# Patient Record
Sex: Male | Born: 1935 | Race: White | Hispanic: No | Marital: Married | Smoking: Former smoker
Health system: Southern US, Community
[De-identification: ages and names within clinical notes are randomized; demographics above are authoritative.]

## PROBLEM LIST (undated history)

## (undated) DIAGNOSIS — C439 Malignant melanoma of skin, unspecified: Secondary | ICD-10-CM

## (undated) DIAGNOSIS — M1710 Unilateral primary osteoarthritis, unspecified knee: Secondary | ICD-10-CM

## (undated) DIAGNOSIS — S2239XA Fracture of one rib, unspecified side, initial encounter for closed fracture: Secondary | ICD-10-CM

## (undated) DIAGNOSIS — N529 Male erectile dysfunction, unspecified: Secondary | ICD-10-CM

## (undated) DIAGNOSIS — M179 Osteoarthritis of knee, unspecified: Secondary | ICD-10-CM

## (undated) DIAGNOSIS — I4819 Other persistent atrial fibrillation: Secondary | ICD-10-CM

## (undated) DIAGNOSIS — I10 Essential (primary) hypertension: Secondary | ICD-10-CM

## (undated) DIAGNOSIS — I77819 Aortic ectasia, unspecified site: Secondary | ICD-10-CM

## (undated) DIAGNOSIS — Z974 Presence of external hearing-aid: Secondary | ICD-10-CM

## (undated) DIAGNOSIS — I351 Nonrheumatic aortic (valve) insufficiency: Secondary | ICD-10-CM

## (undated) DIAGNOSIS — R945 Abnormal results of liver function studies: Secondary | ICD-10-CM

## (undated) DIAGNOSIS — R7989 Other specified abnormal findings of blood chemistry: Secondary | ICD-10-CM

## (undated) DIAGNOSIS — M11269 Other chondrocalcinosis, unspecified knee: Secondary | ICD-10-CM

## (undated) DIAGNOSIS — E119 Type 2 diabetes mellitus without complications: Secondary | ICD-10-CM

## (undated) DIAGNOSIS — I4891 Unspecified atrial fibrillation: Secondary | ICD-10-CM

## (undated) DIAGNOSIS — R0602 Shortness of breath: Secondary | ICD-10-CM

## (undated) DIAGNOSIS — K219 Gastro-esophageal reflux disease without esophagitis: Secondary | ICD-10-CM

## (undated) DIAGNOSIS — H9319 Tinnitus, unspecified ear: Secondary | ICD-10-CM

## (undated) DIAGNOSIS — M7060 Trochanteric bursitis, unspecified hip: Secondary | ICD-10-CM

## (undated) DIAGNOSIS — G4700 Insomnia, unspecified: Secondary | ICD-10-CM

## (undated) DIAGNOSIS — R32 Unspecified urinary incontinence: Secondary | ICD-10-CM

## (undated) DIAGNOSIS — M5416 Radiculopathy, lumbar region: Secondary | ICD-10-CM

## (undated) DIAGNOSIS — W19XXXA Unspecified fall, initial encounter: Secondary | ICD-10-CM

## (undated) DIAGNOSIS — A048 Other specified bacterial intestinal infections: Secondary | ICD-10-CM

## (undated) DIAGNOSIS — I499 Cardiac arrhythmia, unspecified: Secondary | ICD-10-CM

## (undated) DIAGNOSIS — N400 Enlarged prostate without lower urinary tract symptoms: Secondary | ICD-10-CM

## (undated) DIAGNOSIS — I052 Rheumatic mitral stenosis with insufficiency: Secondary | ICD-10-CM

## (undated) HISTORY — DX: Shortness of breath: R06.02

## (undated) HISTORY — PX: CATARACT EXTRACTION: SUR2

## (undated) HISTORY — DX: Unspecified atrial fibrillation: I48.91

## (undated) HISTORY — DX: Gastro-esophageal reflux disease without esophagitis: K21.9

## (undated) HISTORY — DX: Nonrheumatic aortic (valve) insufficiency: I35.1

## (undated) HISTORY — DX: Tinnitus, unspecified ear: H93.19

## (undated) HISTORY — DX: Other specified bacterial intestinal infections: A04.8

## (undated) HISTORY — DX: Other chondrocalcinosis, unspecified knee: M11.269

## (undated) HISTORY — DX: Fracture of one rib, unspecified side, initial encounter for closed fracture: S22.39XA

## (undated) HISTORY — DX: Presence of external hearing-aid: Z97.4

## (undated) HISTORY — DX: Other specified abnormal findings of blood chemistry: R79.89

## (undated) HISTORY — DX: Unspecified urinary incontinence: R32

## (undated) HISTORY — DX: Rheumatic mitral stenosis with insufficiency: I05.2

## (undated) HISTORY — DX: Trochanteric bursitis, unspecified hip: M70.60

## (undated) HISTORY — DX: Benign prostatic hyperplasia without lower urinary tract symptoms: N40.0

## (undated) HISTORY — DX: Male erectile dysfunction, unspecified: N52.9

## (undated) HISTORY — DX: Cardiac arrhythmia, unspecified: I49.9

## (undated) HISTORY — DX: Essential (primary) hypertension: I10

## (undated) HISTORY — DX: Abnormal results of liver function studies: R94.5

## (undated) HISTORY — DX: Radiculopathy, lumbar region: M54.16

## (undated) HISTORY — DX: Insomnia, unspecified: G47.00

## (undated) HISTORY — DX: Unilateral primary osteoarthritis, unspecified knee: M17.10

## (undated) HISTORY — DX: Unspecified fall, initial encounter: W19.XXXA

## (undated) HISTORY — DX: Aortic ectasia, unspecified site: I77.819

## (undated) HISTORY — DX: Osteoarthritis of knee, unspecified: M17.9

## (undated) HISTORY — PX: TONSILLECTOMY: SUR1361

## (undated) HISTORY — DX: Malignant melanoma of skin, unspecified: C43.9

## (undated) HISTORY — DX: Type 2 diabetes mellitus without complications: E11.9

## (undated) HISTORY — PX: CHOLECYSTECTOMY: SHX55

## (undated) HISTORY — DX: Other persistent atrial fibrillation: I48.19

---

## 1990-10-29 DIAGNOSIS — A048 Other specified bacterial intestinal infections: Secondary | ICD-10-CM

## 1990-10-29 HISTORY — DX: Other specified bacterial intestinal infections: A04.8

## 1992-10-29 DIAGNOSIS — I10 Essential (primary) hypertension: Secondary | ICD-10-CM

## 1992-10-29 HISTORY — DX: Essential (primary) hypertension: I10

## 2001-09-30 ENCOUNTER — Encounter: Payer: Self-pay | Admitting: Internal Medicine

## 2001-09-30 ENCOUNTER — Encounter: Admission: RE | Admit: 2001-09-30 | Discharge: 2001-09-30 | Payer: Self-pay | Admitting: Internal Medicine

## 2003-06-14 ENCOUNTER — Inpatient Hospital Stay (HOSPITAL_COMMUNITY): Admission: AD | Admit: 2003-06-14 | Discharge: 2003-06-17 | Payer: Self-pay | Admitting: Internal Medicine

## 2003-06-14 ENCOUNTER — Encounter: Payer: Self-pay | Admitting: Internal Medicine

## 2003-06-15 ENCOUNTER — Encounter: Payer: Self-pay | Admitting: Gastroenterology

## 2003-06-15 ENCOUNTER — Encounter: Payer: Self-pay | Admitting: Internal Medicine

## 2003-06-16 ENCOUNTER — Encounter (INDEPENDENT_AMBULATORY_CARE_PROVIDER_SITE_OTHER): Payer: Self-pay | Admitting: *Deleted

## 2004-02-22 ENCOUNTER — Encounter (INDEPENDENT_AMBULATORY_CARE_PROVIDER_SITE_OTHER): Payer: Self-pay | Admitting: *Deleted

## 2004-02-22 ENCOUNTER — Ambulatory Visit (HOSPITAL_COMMUNITY): Admission: RE | Admit: 2004-02-22 | Discharge: 2004-02-22 | Payer: Self-pay | Admitting: Gastroenterology

## 2008-10-29 DIAGNOSIS — M5416 Radiculopathy, lumbar region: Secondary | ICD-10-CM

## 2008-10-29 DIAGNOSIS — M7060 Trochanteric bursitis, unspecified hip: Secondary | ICD-10-CM

## 2008-10-29 HISTORY — DX: Radiculopathy, lumbar region: M54.16

## 2008-10-29 HISTORY — DX: Trochanteric bursitis, unspecified hip: M70.60

## 2009-04-20 ENCOUNTER — Encounter: Admission: RE | Admit: 2009-04-20 | Discharge: 2009-04-20 | Payer: Self-pay | Admitting: Otolaryngology

## 2011-03-16 NOTE — Op Note (Signed)
[name], [name]                            ACCOUNT NO.:  [id number]   MEDICAL RECORD NO.:  [id number]                   PATIENT TYPE:  INP   LOCATION:  5148                                 FACILITY:  [facility]   PHYSICIAN:  Velora Heckler, M.D.                DATE OF BIRTH:  19-Aug-1931   DATE OF PROCEDURE:  06/16/2003  DATE OF DISCHARGE:                                 OPERATIVE REPORT   PREOPERATIVE DIAGNOSIS:  Symptomatic cholelithiasis, choledocholithiasis.   POSTOPERATIVE DIAGNOSIS:  Symptomatic cholelithiasis, choledocholithiasis.   OPERATION PERFORMED:  Laparoscopic cholecystectomy.   SURGEON:  Velora Heckler, M.D.   ASSISTANT:  Magnus Ivan, RN   ANESTHESIA:  General.   ESTIMATED BLOOD LOSS:  Minimal.   COMPLICATIONS:  None.   PREPARATION:  Betadine.   INDICATIONS FOR PROCEDURE:  The patient is a 77 year old white male who  presented with a history of right upper quadrant abdominal pain.  He was  seen by his primary care physician and admitted to [facility].  Ultrasound demonstrated gallstones.  Liver function tests were abnormal.  Repeat liver function tests showed a marked elevation of total bilirubin and  transaminases.  The patient was seen by gastroenterology and underwent ERCP  with sphincterotomy and stone extraction.  The patient now comes to surgery  for cholecystectomy for cholelithiasis and history of common bile duct  stones.   DESCRIPTION OF PROCEDURE:  The procedure was done in operating room #16  at  the [facility]. [facility].  The patient was brought to the  operating room and placed in supine position on the operating room table.  Following administration of general anesthesia, the patient was prepped and  draped in the usual strict aseptic fashion.  After ascertaining that an  adequate level of anesthesia had been obtained,  an infraumbilical incision  was made with a #15 blade and carried down to the fascia.  The fascia  was  incised in the midline and the peritoneal cavity was entered cautiously.  A  0 Vicryl pursestring suture was placed in the fascia.  A Hasson cannula was  introduced and secured with a pursestring suture.  The abdomen was  insufflated with carbon dioxide.  The laparoscope was introduced under  direct vision.  Operative ports were placed along the right costal margin in  the midline, midclavicular line, and anterior axillary line.  Fundus of the  gallbladder was grasped and retracted cephalad.  Dissection was begun at the  neck of the gallbladder.  Cystic duct was dissected out along its length,  triply clipped and divided.  Cystic artery was dissected out, triply  clipped, and divided.  The gallbladder was then excised from the gallbladder  bed using the hook electrocautery for hemostasis.  A large venous tributary  on the left side of the gallbladder bed was also clipped with a Ligaclip.  The gallbladder was  completely excised and placed into an EndoCatch bag.  It  was extracted through the umbilical port without difficulty.  The right  upper quadrant was copiously irrigated with warm saline which was evacuated.  Good hemostasis was noted.  Ports were removed under direct vision.  Good  hemostasis was noted at all port sites.  Pneumoperitoneum was released.  All  ports were removed.  0 Vicryl pursestring suture was tied securely.  Port  sites were anesthetized with local Marcaine anesthetic.  All four sites were  closed with interrupted 4-0 Vicryl subcuticular sutures.  The wounds were  washed and dried and benzoin and Steri-Strips were applied.  Sterile gauze  dressings were applied.  The patient was awakened from anesthesia and  brought to the recovery room in stable condition.  The patient tolerated the  procedure well.                                                 Velora Heckler, M.D.    TMG/MEDQ  D:  06/16/2003  T:  06/17/2003  Job:  [id number]   cc:   Theressa Millard, M.D.   [address] [place]  [place] [address]  Fax: [phone]

## 2011-03-16 NOTE — Op Note (Signed)
[name], [name] NO.:  [id number]   MEDICAL RECORD NO.:  [id number]                   PATIENT TYPE:  INP   LOCATION:  5148                                 FACILITY:  [facility]   PHYSICIAN:  Graylin Shiver, M.D.                DATE OF BIRTH:  05-31-1931   DATE OF PROCEDURE:  06/16/2003  DATE OF DISCHARGE:                                 OPERATIVE REPORT   PROCEDURE PERFORMED:  Endoscopic retrograde cholangiopancreatography with  sphincterotomy.   INDICATIONS FOR PROCEDURE:  Cholecystitis, choledocholithiasis probable,  based on findings of gallstone and elevated liver enzymes.   The procedure was explained along with the potential risks of bleeding,  infection, perforation and pancreatitis.   PREMEDICATION:  Fentanyl 100 mcg IV, Versed 12 mg IV.   DESCRIPTION OF PROCEDURE:  With the patient in the left lateral position,  the Olympus lateral viewing duodenoscope was inserted into the oropharynx  and passed into the esophagus.  It was advanced down the esophagus, then  into the stomach, then into the duodenum.  No abnormalities were seen in the  stomach or the duodenum.  The papilla of Vater was located.  There was a  diverticulum adjacent to the papilla.  Selective cannulation of the common  bile duct was achieved using a guidewire and papillotome.  Contrast was  injected into the common bile duct and biliary tree.  There were a couple of  small stones noted in the distal common bile duct.  There was no dilatation  of the biliary tree.  There was some slight filling of the cystic duct but  no filling of the gallbladder.  The procedure was done in the endo unit  using a C-arm.  After proper localization of the sphincterotome, a  sphincterotomy was made with good results.  The sphincterotome was then  removed leaving the guidewire in place in the biliary tree.  The 8.5 mm  endoscopic balloon was advanced over the guidewire and up into the bile  duct. It was inflated and the duct was swept twice with removal of stones,  repeat injection showed a clear common bile duct.  The patient tolerated the  procedure well without complications.    IMPRESSION:  Choledocholithiasis status post sphincterotomy with stone  extraction.   PLAN:  Proceed with laparoscopic cholecystectomy.                                               Graylin Shiver, M.D.    SFG/MEDQ  D:  06/16/2003  T:  06/16/2003  Job:  [id number]   cc:   Theressa Millard, M.D.  [address] [place]  [place] [address]  Fax: [phone]   Velora Heckler, M.D.  [phone] [address]  [place]  [place] [address]  Fax: [phone]

## 2011-03-16 NOTE — Consult Note (Signed)
[name], [name] NO.:  [id number]   MEDICAL RECORD NO.:  [id number]                   PATIENT TYPE:  INP   LOCATION:  5148                                 FACILITY:  [facility]   PHYSICIAN:  Velora Heckler, M.D.                DATE OF BIRTH:  09/05/1931   DATE OF CONSULTATION:  06/14/2003  DATE OF DISCHARGE:                                   CONSULTATION   REFERRING PHYSICIAN:  Theressa Millard, M.D.   CHIEF COMPLAINT:  Right upper quadrant abdominal pain.   HISTORY OF PRESENT ILLNESS:  The patient is a 77 year old white male who  presents with a history of onset of right upper quadrant abdominal pain  approximately noon today following a large lunch of roast beef.  This was  associated with mild nausea.  Pain progressed.  The patient went to the  local pharmacy and took a dose of Pepto-Bismol without symptomatic relief.  The pain became more severe.  The patient presented to Theressa Millard, M.D.  office for evaluation.  He was then admitted directly to [facility].  The patient underwent ultrasound of the abdomen which  demonstrated a solitary 7 mm nonmobile gallstone.  No other acute findings  were noted.  Laboratory studies are pending.  The patient gives a history of  a similar episode occurring approximately two weeks ago.  At that time the  episode lasted approximately one hour.  The patient now notes that the pain  is resolving after approximately six to seven hours' duration.  General  surgery is consulted.   PAST MEDICAL HISTORY:  1. Status post right knee surgery.  2. History of nasal fracture with repair.  3. History of type 2 diabetes.  4. History of hypertension.  5. History of gastroesophageal reflux disease.  6. History of hypercholesterolemia.   MEDICATIONS:  1. Actos 10 mg q.a.m.  2. DiaBeta 2.5 mg daily.  3. Vasotec.  4. TriCor 160 mg daily.  5. Lipitor 20 mg daily.  6. Glucophage 2000 mg daily.  7. Prilosec 20 mg  daily.  8. Aspirin.   ALLERGIES:  PENICILLIN causing swelling and rash.   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  He smokes a cigar occasionally.  He  drinks alcohol socially.  He is accompanied by multiple family members.   FAMILY HISTORY:  No significant family history.  No adverse reactions to  anesthesia or surgery.   REVIEW OF SYSTEMS:  15 system review without significant other positive  except as noted above.   PHYSICAL EXAMINATION:  GENERAL:  A 77 year old bright, alert white male on  ward [address] in no acute distress.  VITAL SIGNS:  Temperature 98.7, pulse 115, respirations 20, blood pressure  145/67, O2 saturation 92% on room air.  HEENT:  Normocephalic.  Sclerae are  clear.  Conjunctivae are clear.  Dentition is fair.  Voice is normal.  NECK:  Symmetric.  Thyroid is normal without nodularity.  There is no  anterior/posterior cervical adenopathy.  LUNGS:  Clear to auscultation without rales or rhonchi.  CARDIAC:  Regular rate and rhythm without murmur.  Peripheral pulses are  full.  ABDOMEN:  Soft without distention.  Bowel sounds are present.  On percussion  and palpation there is no significant tenderness.  There is no  hepatosplenomegaly.  There are no palpable masses.  There is no sign of  hernia.  EXTREMITIES:  Nontender without edema.  NEUROLOGIC:  The patient is alert and oriented without focal deficit.   DATA REVIEWED:  Abdominal ultrasound results are reviewed.  This shows a 7  mm nonmobile density in the gallbladder consistent with cholelithiasis.  There are no acute inflammatory findings.  Laboratory studies to include  CBC, comprehensive metabolic profile, coagulation studies, amylase, lipase,  and urinalysis are all still pending at the time of dictation.   IMPRESSION:  Symptomatic cholelithiasis, probable episode of biliary colic,  now resolving.   PLAN:  1. Observation  overnight.  2. Follow up laboratory studies.  3. Follow up physical examination in a.m. August 17.  4. Follow up in office for consideration for elective cholecystectomy at a     later date.                                               Velora Heckler, M.D.    TMG/MEDQ  D:  06/14/2003  T:  06/15/2003  Job:  [id number]   cc:   Theressa Millard, M.D.  [address] [place]  [place] [address]  Fax: [phone]

## 2011-03-16 NOTE — Consult Note (Signed)
[name], [name]                            ACCOUNT NO.:  [id number]   MEDICAL RECORD NO.:  [id number]                   PATIENT TYPE:  INP   LOCATION:  5148                                 FACILITY:  [facility]   PHYSICIAN:  James L. Malon Kindle., M.D.          DATE OF BIRTH:  07/29/1931   DATE OF CONSULTATION:  06/15/2003  DATE OF DISCHARGE:                                   CONSULTATION   REFERRING PHYSICIAN:  Dr. Theressa Millard.   REASON FOR CONSULTATION:  Possible common duct stone.   HISTORY:  Very nice 77 year old gentleman who got a gallbladder ultrasound  several years ago and it was normal.  He presented with new onset of right  upper quadrant pain with severe nausea and was seen by Dr. Earl Gala in the  office and admitted to the hospital.  Ultrasound showed gallstones, fatty  liver, but no clear dilated ducts.  The patient clinically feels better.  His white count was normal at 6.0 with a normal amylase and lipase.  Liver  tests on admitted revealed a total bilirubin of 2.6, increasing to 3.8 with  elevated transaminases.  Alkaline phosphatase was normal.  The issue is one  of possible common duct stone.   CURRENT MEDICATIONS:  1. Glucophage 500 mg two b.i.d.  2. Ambien 5 mg q.h.s.  3. Vasotec 20 mg daily.  4. DiaBeta 5 mg at lunch.  5. Motrin p.r.n.  6. Viagra one-half tablet p.r.n.  7. Prilosec 20 mg daily.  8. Vioxx p.r.n.  9. Tricor 160 mg daily.   ALLERGIES:  The patient is allergic to PENICILLIN.   MEDICAL HISTORY:  1. Non-insulin-dependent diabetes mellitus.  2. He has a history of hypertension.  3. Gastroesophageal reflux disease.  4. Chronic low back pain.   PAST SURGICAL HISTORY:  He has had a previous colonoscopy for heme-positive  stools in 1992 and apparently was unremarkable.  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   FAMILY HISTORY:  Family history is remarkable for heart  disease,  hypertension and diabetes; negative for GI cancer.   SOCIAL HISTORY:  He is married, is retired, drinks occasionally.   PHYSICAL EXAMINATION:  VITAL SIGNS:  The patient is afebrile, vital signs  normal.  GENERAL:  A pleasant, alert, oriented white male in no acute distress.  EYES:  Sclerae nonicteric.  NECK:  Neck is supple with no lymphadenopathy.  LUNGS:  Clear.  HEART:  Regular rate and rhythm without murmurs or gallops.  ABDOMEN:  Abdomen is soft and nontender with good bowel sounds.   ASSESSMENT:  Gallstones with elevated liver tests.  I agree that a common  duct stone is highly likely and the stone may well be ball-valving in the  common duct.  I agree with Dr. Velora Heckler that a preoperative endoscopic  retrograde cholangiopancreatogram in  this patient will be appropriate.  He  and I have discussed the case.   PLAN:  We will proceed at this time with an ERCP with possible  sphincterotomy and stone extraction.  This will be done later this afternoon  by either Dr. Graylin Shiver or Dr. Petra Kuba.  The risks of bleeding  and pancreatitis have been discussed with the patient.                                               James L. Malon Kindle., M.D.    Waldron Session  D:  06/15/2003  T:  06/16/2003  Job:  [id number]   cc:   Theressa Millard, M.D.  [address] [place]  [place] [address]  Fax: [phone]   Velora Heckler, M.D.  [phone] [address] [place]  [place] [address]  Fax: [phone]

## 2011-03-16 NOTE — Discharge Summary (Signed)
   [name], [name]                            ACCOUNT NO.:  [id number]   MEDICAL RECORD NO.:  [id number]                   PATIENT TYPE:  INP   LOCATION:  5148                                 FACILITY:  [facility]   PHYSICIAN:  Velora Heckler, M.D.                DATE OF BIRTH:  02/19/1931   DATE OF ADMISSION:  06/14/2003  DATE OF DISCHARGE:  06/17/2003                                 DISCHARGE SUMMARY   REASON FOR ADMISSION:  Abdominal pain.   BRIEF HISTORY:  The patient is a 77 year old white male admitted by Dr.  Theressa Millard from the office for acute onset right upper quadrant abdominal  pain and nausea.  Ultrasound demonstrated a solitary gallstone.  The patient  had a similar episode approximately two weeks prior to admission.  The  patient was seen in consultation by general surgery.  He had elevated liver  function tests on laboratory studies.  After counseling with patient and his  family, decision was made to proceed to the operating room.  He was also  seen in consultation by Dr. Carman Ching from gastroenterology.   The patient was prepared and taken to the operating room on August 18.  He  underwent laparoscopic cholecystectomy without complications.  Postoperatively the patient did well and was discharged home the following  day.   DISCHARGE PLANNING:  The patient is discharged home June 17, 2003, in good  condition, tolerating a regular diet, ambulating independently.  The patient  will be seen back at my office at [facility] in two to three  weeks.   DISCHARGE MEDICATIONS:  Include Vicodin as needed for pain.   FINAL DIAGNOSES:  1. Biliary colic.  2.     Cholelithiasis.  3. Rule out choledocholithiasis.   CONDITION ON DISCHARGE:  Improved.                                                Velora Heckler, M.D.    TMG/MEDQ  D:  08/19/2003  T:  08/19/2003  Job:  [id number]   cc:   Theressa Millard, M.D.  [address] [place]  [place]  [address]  Fax: [phone]   Llana Aliment. Malon Kindle., M.D.  [address]  [place]  [place] [address]  Fax: [phone]   [facility]

## 2011-10-30 DIAGNOSIS — W19XXXA Unspecified fall, initial encounter: Secondary | ICD-10-CM

## 2011-10-30 DIAGNOSIS — S2239XA Fracture of one rib, unspecified side, initial encounter for closed fracture: Secondary | ICD-10-CM

## 2011-10-30 HISTORY — DX: Unspecified fall, initial encounter: W19.XXXA

## 2011-10-30 HISTORY — DX: Fracture of one rib, unspecified side, initial encounter for closed fracture: S22.39XA

## 2011-11-21 DIAGNOSIS — R05 Cough: Secondary | ICD-10-CM | POA: Diagnosis not present

## 2012-03-04 DIAGNOSIS — H903 Sensorineural hearing loss, bilateral: Secondary | ICD-10-CM | POA: Diagnosis not present

## 2012-03-17 DIAGNOSIS — E7800 Pure hypercholesterolemia, unspecified: Secondary | ICD-10-CM | POA: Diagnosis not present

## 2012-03-17 DIAGNOSIS — E11319 Type 2 diabetes mellitus with unspecified diabetic retinopathy without macular edema: Secondary | ICD-10-CM | POA: Diagnosis not present

## 2012-03-17 DIAGNOSIS — E1139 Type 2 diabetes mellitus with other diabetic ophthalmic complication: Secondary | ICD-10-CM | POA: Diagnosis not present

## 2012-03-17 DIAGNOSIS — I10 Essential (primary) hypertension: Secondary | ICD-10-CM | POA: Diagnosis not present

## 2012-04-03 ENCOUNTER — Other Ambulatory Visit: Payer: Self-pay | Admitting: Dermatology

## 2012-04-03 DIAGNOSIS — C44529 Squamous cell carcinoma of skin of other part of trunk: Secondary | ICD-10-CM | POA: Diagnosis not present

## 2012-04-03 DIAGNOSIS — C4359 Malignant melanoma of other part of trunk: Secondary | ICD-10-CM | POA: Diagnosis not present

## 2012-04-03 DIAGNOSIS — L820 Inflamed seborrheic keratosis: Secondary | ICD-10-CM | POA: Diagnosis not present

## 2012-04-03 DIAGNOSIS — L821 Other seborrheic keratosis: Secondary | ICD-10-CM | POA: Diagnosis not present

## 2012-04-03 DIAGNOSIS — C4360 Malignant melanoma of unspecified upper limb, including shoulder: Secondary | ICD-10-CM | POA: Diagnosis not present

## 2012-04-03 DIAGNOSIS — D045 Carcinoma in situ of skin of trunk: Secondary | ICD-10-CM | POA: Diagnosis not present

## 2012-04-03 DIAGNOSIS — L570 Actinic keratosis: Secondary | ICD-10-CM | POA: Diagnosis not present

## 2012-04-14 DIAGNOSIS — E11329 Type 2 diabetes mellitus with mild nonproliferative diabetic retinopathy without macular edema: Secondary | ICD-10-CM | POA: Diagnosis not present

## 2012-04-14 DIAGNOSIS — C4360 Malignant melanoma of unspecified upper limb, including shoulder: Secondary | ICD-10-CM | POA: Diagnosis not present

## 2012-04-14 DIAGNOSIS — E1139 Type 2 diabetes mellitus with other diabetic ophthalmic complication: Secondary | ICD-10-CM | POA: Diagnosis not present

## 2012-04-15 ENCOUNTER — Other Ambulatory Visit: Payer: Self-pay | Admitting: Dermatology

## 2012-04-15 DIAGNOSIS — C4360 Malignant melanoma of unspecified upper limb, including shoulder: Secondary | ICD-10-CM | POA: Diagnosis not present

## 2012-09-30 DIAGNOSIS — E11319 Type 2 diabetes mellitus with unspecified diabetic retinopathy without macular edema: Secondary | ICD-10-CM | POA: Diagnosis not present

## 2012-09-30 DIAGNOSIS — E1139 Type 2 diabetes mellitus with other diabetic ophthalmic complication: Secondary | ICD-10-CM | POA: Diagnosis not present

## 2012-09-30 DIAGNOSIS — Z0000 Encounter for general adult medical examination without abnormal findings: Secondary | ICD-10-CM | POA: Diagnosis not present

## 2012-09-30 DIAGNOSIS — Z1331 Encounter for screening for depression: Secondary | ICD-10-CM | POA: Diagnosis not present

## 2012-09-30 DIAGNOSIS — I10 Essential (primary) hypertension: Secondary | ICD-10-CM | POA: Diagnosis not present

## 2012-10-03 ENCOUNTER — Other Ambulatory Visit: Payer: Self-pay | Admitting: Dermatology

## 2012-10-03 DIAGNOSIS — L819 Disorder of pigmentation, unspecified: Secondary | ICD-10-CM | POA: Diagnosis not present

## 2012-10-03 DIAGNOSIS — L570 Actinic keratosis: Secondary | ICD-10-CM | POA: Diagnosis not present

## 2012-10-03 DIAGNOSIS — L821 Other seborrheic keratosis: Secondary | ICD-10-CM | POA: Diagnosis not present

## 2012-10-03 DIAGNOSIS — Z85828 Personal history of other malignant neoplasm of skin: Secondary | ICD-10-CM | POA: Diagnosis not present

## 2012-10-03 DIAGNOSIS — Z85820 Personal history of malignant melanoma of skin: Secondary | ICD-10-CM | POA: Diagnosis not present

## 2012-10-03 DIAGNOSIS — D485 Neoplasm of uncertain behavior of skin: Secondary | ICD-10-CM | POA: Diagnosis not present

## 2012-11-03 DIAGNOSIS — S2239XA Fracture of one rib, unspecified side, initial encounter for closed fracture: Secondary | ICD-10-CM | POA: Diagnosis not present

## 2013-03-18 DIAGNOSIS — E11319 Type 2 diabetes mellitus with unspecified diabetic retinopathy without macular edema: Secondary | ICD-10-CM | POA: Diagnosis not present

## 2013-03-18 DIAGNOSIS — G479 Sleep disorder, unspecified: Secondary | ICD-10-CM | POA: Diagnosis not present

## 2013-03-18 DIAGNOSIS — I10 Essential (primary) hypertension: Secondary | ICD-10-CM | POA: Diagnosis not present

## 2013-03-18 DIAGNOSIS — E1139 Type 2 diabetes mellitus with other diabetic ophthalmic complication: Secondary | ICD-10-CM | POA: Diagnosis not present

## 2013-03-27 ENCOUNTER — Other Ambulatory Visit: Payer: Self-pay | Admitting: Dermatology

## 2013-03-27 DIAGNOSIS — Z85828 Personal history of other malignant neoplasm of skin: Secondary | ICD-10-CM | POA: Diagnosis not present

## 2013-03-27 DIAGNOSIS — L570 Actinic keratosis: Secondary | ICD-10-CM | POA: Diagnosis not present

## 2013-03-27 DIAGNOSIS — D485 Neoplasm of uncertain behavior of skin: Secondary | ICD-10-CM | POA: Diagnosis not present

## 2013-03-27 DIAGNOSIS — C44319 Basal cell carcinoma of skin of other parts of face: Secondary | ICD-10-CM | POA: Diagnosis not present

## 2013-03-27 DIAGNOSIS — L821 Other seborrheic keratosis: Secondary | ICD-10-CM | POA: Diagnosis not present

## 2013-04-16 DIAGNOSIS — H353190 Nonexudative age-related macular degeneration, unspecified eye, stage unspecified: Secondary | ICD-10-CM | POA: Diagnosis not present

## 2013-04-20 DIAGNOSIS — Z85828 Personal history of other malignant neoplasm of skin: Secondary | ICD-10-CM | POA: Diagnosis not present

## 2013-04-20 DIAGNOSIS — C44319 Basal cell carcinoma of skin of other parts of face: Secondary | ICD-10-CM | POA: Diagnosis not present

## 2013-04-27 DIAGNOSIS — L570 Actinic keratosis: Secondary | ICD-10-CM | POA: Diagnosis not present

## 2013-07-26 DIAGNOSIS — Z23 Encounter for immunization: Secondary | ICD-10-CM | POA: Diagnosis not present

## 2013-10-05 DIAGNOSIS — E1139 Type 2 diabetes mellitus with other diabetic ophthalmic complication: Secondary | ICD-10-CM | POA: Diagnosis not present

## 2013-10-05 DIAGNOSIS — Z0000 Encounter for general adult medical examination without abnormal findings: Secondary | ICD-10-CM | POA: Diagnosis not present

## 2013-10-05 DIAGNOSIS — E11319 Type 2 diabetes mellitus with unspecified diabetic retinopathy without macular edema: Secondary | ICD-10-CM | POA: Diagnosis not present

## 2013-10-05 DIAGNOSIS — Z1331 Encounter for screening for depression: Secondary | ICD-10-CM | POA: Diagnosis not present

## 2013-10-05 DIAGNOSIS — I10 Essential (primary) hypertension: Secondary | ICD-10-CM | POA: Diagnosis not present

## 2013-10-12 DIAGNOSIS — M25569 Pain in unspecified knee: Secondary | ICD-10-CM | POA: Diagnosis not present

## 2013-10-12 DIAGNOSIS — R29898 Other symptoms and signs involving the musculoskeletal system: Secondary | ICD-10-CM | POA: Diagnosis not present

## 2013-10-13 DIAGNOSIS — Z85828 Personal history of other malignant neoplasm of skin: Secondary | ICD-10-CM | POA: Diagnosis not present

## 2013-10-13 DIAGNOSIS — D235 Other benign neoplasm of skin of trunk: Secondary | ICD-10-CM | POA: Diagnosis not present

## 2013-10-13 DIAGNOSIS — L570 Actinic keratosis: Secondary | ICD-10-CM | POA: Diagnosis not present

## 2013-10-13 DIAGNOSIS — L259 Unspecified contact dermatitis, unspecified cause: Secondary | ICD-10-CM | POA: Diagnosis not present

## 2013-10-13 DIAGNOSIS — L821 Other seborrheic keratosis: Secondary | ICD-10-CM | POA: Diagnosis not present

## 2014-01-26 DIAGNOSIS — M1710 Unilateral primary osteoarthritis, unspecified knee: Secondary | ICD-10-CM | POA: Diagnosis not present

## 2014-01-26 DIAGNOSIS — IMO0002 Reserved for concepts with insufficient information to code with codable children: Secondary | ICD-10-CM | POA: Diagnosis not present

## 2014-01-26 DIAGNOSIS — M1909 Primary osteoarthritis, other specified site: Secondary | ICD-10-CM | POA: Diagnosis not present

## 2014-01-26 DIAGNOSIS — M25569 Pain in unspecified knee: Secondary | ICD-10-CM | POA: Diagnosis not present

## 2014-02-04 DIAGNOSIS — M1909 Primary osteoarthritis, other specified site: Secondary | ICD-10-CM | POA: Diagnosis not present

## 2014-03-03 DIAGNOSIS — M1710 Unilateral primary osteoarthritis, unspecified knee: Secondary | ICD-10-CM | POA: Diagnosis not present

## 2014-03-26 DIAGNOSIS — L570 Actinic keratosis: Secondary | ICD-10-CM | POA: Diagnosis not present

## 2014-03-26 DIAGNOSIS — Z85828 Personal history of other malignant neoplasm of skin: Secondary | ICD-10-CM | POA: Diagnosis not present

## 2014-03-26 DIAGNOSIS — L821 Other seborrheic keratosis: Secondary | ICD-10-CM | POA: Diagnosis not present

## 2014-04-01 DIAGNOSIS — H04129 Dry eye syndrome of unspecified lacrimal gland: Secondary | ICD-10-CM | POA: Diagnosis not present

## 2014-04-01 DIAGNOSIS — E119 Type 2 diabetes mellitus without complications: Secondary | ICD-10-CM | POA: Diagnosis not present

## 2014-04-01 DIAGNOSIS — H353190 Nonexudative age-related macular degeneration, unspecified eye, stage unspecified: Secondary | ICD-10-CM | POA: Diagnosis not present

## 2014-04-05 DIAGNOSIS — M1710 Unilateral primary osteoarthritis, unspecified knee: Secondary | ICD-10-CM | POA: Diagnosis not present

## 2014-04-06 DIAGNOSIS — G479 Sleep disorder, unspecified: Secondary | ICD-10-CM | POA: Diagnosis not present

## 2014-04-06 DIAGNOSIS — E1139 Type 2 diabetes mellitus with other diabetic ophthalmic complication: Secondary | ICD-10-CM | POA: Diagnosis not present

## 2014-04-06 DIAGNOSIS — I10 Essential (primary) hypertension: Secondary | ICD-10-CM | POA: Diagnosis not present

## 2014-04-06 DIAGNOSIS — E11319 Type 2 diabetes mellitus with unspecified diabetic retinopathy without macular edema: Secondary | ICD-10-CM | POA: Diagnosis not present

## 2014-06-25 DIAGNOSIS — M1710 Unilateral primary osteoarthritis, unspecified knee: Secondary | ICD-10-CM | POA: Diagnosis not present

## 2014-07-14 DIAGNOSIS — H905 Unspecified sensorineural hearing loss: Secondary | ICD-10-CM | POA: Diagnosis not present

## 2014-07-14 DIAGNOSIS — H903 Sensorineural hearing loss, bilateral: Secondary | ICD-10-CM | POA: Diagnosis not present

## 2014-07-26 DIAGNOSIS — M1710 Unilateral primary osteoarthritis, unspecified knee: Secondary | ICD-10-CM | POA: Diagnosis not present

## 2014-08-08 DIAGNOSIS — Z23 Encounter for immunization: Secondary | ICD-10-CM | POA: Diagnosis not present

## 2014-10-11 DIAGNOSIS — M1712 Unilateral primary osteoarthritis, left knee: Secondary | ICD-10-CM | POA: Diagnosis not present

## 2014-10-13 DIAGNOSIS — Z8709 Personal history of other diseases of the respiratory system: Secondary | ICD-10-CM | POA: Diagnosis not present

## 2014-10-13 DIAGNOSIS — I10 Essential (primary) hypertension: Secondary | ICD-10-CM | POA: Diagnosis not present

## 2014-10-13 DIAGNOSIS — E08319 Diabetes mellitus due to underlying condition with unspecified diabetic retinopathy without macular edema: Secondary | ICD-10-CM | POA: Diagnosis not present

## 2014-10-13 DIAGNOSIS — K219 Gastro-esophageal reflux disease without esophagitis: Secondary | ICD-10-CM | POA: Diagnosis not present

## 2014-10-13 DIAGNOSIS — Z0000 Encounter for general adult medical examination without abnormal findings: Secondary | ICD-10-CM | POA: Diagnosis not present

## 2014-10-13 DIAGNOSIS — E780 Pure hypercholesterolemia: Secondary | ICD-10-CM | POA: Diagnosis not present

## 2014-10-13 DIAGNOSIS — E11319 Type 2 diabetes mellitus with unspecified diabetic retinopathy without macular edema: Secondary | ICD-10-CM | POA: Diagnosis not present

## 2015-01-20 DIAGNOSIS — E785 Hyperlipidemia, unspecified: Secondary | ICD-10-CM | POA: Diagnosis not present

## 2015-01-20 DIAGNOSIS — I10 Essential (primary) hypertension: Secondary | ICD-10-CM | POA: Diagnosis not present

## 2015-04-05 DIAGNOSIS — L920 Granuloma annulare: Secondary | ICD-10-CM | POA: Diagnosis not present

## 2015-04-05 DIAGNOSIS — L812 Freckles: Secondary | ICD-10-CM | POA: Diagnosis not present

## 2015-04-05 DIAGNOSIS — L578 Other skin changes due to chronic exposure to nonionizing radiation: Secondary | ICD-10-CM | POA: Diagnosis not present

## 2015-04-05 DIAGNOSIS — L821 Other seborrheic keratosis: Secondary | ICD-10-CM | POA: Diagnosis not present

## 2015-04-05 DIAGNOSIS — Z85828 Personal history of other malignant neoplasm of skin: Secondary | ICD-10-CM | POA: Diagnosis not present

## 2015-04-05 DIAGNOSIS — L570 Actinic keratosis: Secondary | ICD-10-CM | POA: Diagnosis not present

## 2015-04-11 DIAGNOSIS — E11319 Type 2 diabetes mellitus with unspecified diabetic retinopathy without macular edema: Secondary | ICD-10-CM | POA: Diagnosis not present

## 2015-04-11 DIAGNOSIS — I10 Essential (primary) hypertension: Secondary | ICD-10-CM | POA: Diagnosis not present

## 2015-04-11 DIAGNOSIS — Z23 Encounter for immunization: Secondary | ICD-10-CM | POA: Diagnosis not present

## 2015-04-14 DIAGNOSIS — E119 Type 2 diabetes mellitus without complications: Secondary | ICD-10-CM | POA: Diagnosis not present

## 2015-04-14 DIAGNOSIS — H524 Presbyopia: Secondary | ICD-10-CM | POA: Diagnosis not present

## 2015-04-14 DIAGNOSIS — H3531 Nonexudative age-related macular degeneration: Secondary | ICD-10-CM | POA: Diagnosis not present

## 2015-05-19 DIAGNOSIS — Z85828 Personal history of other malignant neoplasm of skin: Secondary | ICD-10-CM | POA: Diagnosis not present

## 2015-05-19 DIAGNOSIS — L720 Epidermal cyst: Secondary | ICD-10-CM | POA: Diagnosis not present

## 2015-05-19 DIAGNOSIS — L570 Actinic keratosis: Secondary | ICD-10-CM | POA: Diagnosis not present

## 2015-07-25 DIAGNOSIS — M1712 Unilateral primary osteoarthritis, left knee: Secondary | ICD-10-CM | POA: Diagnosis not present

## 2015-09-08 DIAGNOSIS — Z23 Encounter for immunization: Secondary | ICD-10-CM | POA: Diagnosis not present

## 2015-10-03 DIAGNOSIS — M542 Cervicalgia: Secondary | ICD-10-CM | POA: Diagnosis not present

## 2015-10-03 DIAGNOSIS — Z0000 Encounter for general adult medical examination without abnormal findings: Secondary | ICD-10-CM | POA: Diagnosis not present

## 2015-10-03 DIAGNOSIS — I10 Essential (primary) hypertension: Secondary | ICD-10-CM | POA: Diagnosis not present

## 2015-10-03 DIAGNOSIS — E7800 Pure hypercholesterolemia, unspecified: Secondary | ICD-10-CM | POA: Diagnosis not present

## 2015-10-03 DIAGNOSIS — E11319 Type 2 diabetes mellitus with unspecified diabetic retinopathy without macular edema: Secondary | ICD-10-CM | POA: Diagnosis not present

## 2015-10-03 DIAGNOSIS — K219 Gastro-esophageal reflux disease without esophagitis: Secondary | ICD-10-CM | POA: Diagnosis not present

## 2015-10-03 DIAGNOSIS — Z7984 Long term (current) use of oral hypoglycemic drugs: Secondary | ICD-10-CM | POA: Diagnosis not present

## 2015-10-04 DIAGNOSIS — Z85828 Personal history of other malignant neoplasm of skin: Secondary | ICD-10-CM | POA: Diagnosis not present

## 2015-10-04 DIAGNOSIS — D692 Other nonthrombocytopenic purpura: Secondary | ICD-10-CM | POA: Diagnosis not present

## 2015-10-04 DIAGNOSIS — L820 Inflamed seborrheic keratosis: Secondary | ICD-10-CM | POA: Diagnosis not present

## 2015-10-04 DIAGNOSIS — L570 Actinic keratosis: Secondary | ICD-10-CM | POA: Diagnosis not present

## 2015-10-04 DIAGNOSIS — L821 Other seborrheic keratosis: Secondary | ICD-10-CM | POA: Diagnosis not present

## 2015-11-28 DIAGNOSIS — M71572 Other bursitis, not elsewhere classified, left ankle and foot: Secondary | ICD-10-CM | POA: Diagnosis not present

## 2015-11-28 DIAGNOSIS — M722 Plantar fascial fibromatosis: Secondary | ICD-10-CM | POA: Diagnosis not present

## 2015-11-28 DIAGNOSIS — M1712 Unilateral primary osteoarthritis, left knee: Secondary | ICD-10-CM | POA: Diagnosis not present

## 2015-11-29 DIAGNOSIS — M722 Plantar fascial fibromatosis: Secondary | ICD-10-CM | POA: Diagnosis not present

## 2015-12-01 DIAGNOSIS — M722 Plantar fascial fibromatosis: Secondary | ICD-10-CM | POA: Diagnosis not present

## 2015-12-07 DIAGNOSIS — M722 Plantar fascial fibromatosis: Secondary | ICD-10-CM | POA: Diagnosis not present

## 2015-12-12 DIAGNOSIS — M722 Plantar fascial fibromatosis: Secondary | ICD-10-CM | POA: Diagnosis not present

## 2015-12-12 DIAGNOSIS — M71572 Other bursitis, not elsewhere classified, left ankle and foot: Secondary | ICD-10-CM | POA: Diagnosis not present

## 2015-12-12 DIAGNOSIS — M1712 Unilateral primary osteoarthritis, left knee: Secondary | ICD-10-CM | POA: Diagnosis not present

## 2016-01-02 DIAGNOSIS — M1712 Unilateral primary osteoarthritis, left knee: Secondary | ICD-10-CM | POA: Diagnosis not present

## 2016-01-02 DIAGNOSIS — M71572 Other bursitis, not elsewhere classified, left ankle and foot: Secondary | ICD-10-CM | POA: Diagnosis not present

## 2016-01-02 DIAGNOSIS — M722 Plantar fascial fibromatosis: Secondary | ICD-10-CM | POA: Diagnosis not present

## 2016-02-22 DIAGNOSIS — H5319 Other subjective visual disturbances: Secondary | ICD-10-CM | POA: Diagnosis not present

## 2016-02-22 DIAGNOSIS — E119 Type 2 diabetes mellitus without complications: Secondary | ICD-10-CM | POA: Diagnosis not present

## 2016-02-22 DIAGNOSIS — H04123 Dry eye syndrome of bilateral lacrimal glands: Secondary | ICD-10-CM | POA: Diagnosis not present

## 2016-03-02 DIAGNOSIS — H353132 Nonexudative age-related macular degeneration, bilateral, intermediate dry stage: Secondary | ICD-10-CM | POA: Diagnosis not present

## 2016-03-02 DIAGNOSIS — Z961 Presence of intraocular lens: Secondary | ICD-10-CM | POA: Diagnosis not present

## 2016-03-02 DIAGNOSIS — H53411 Scotoma involving central area, right eye: Secondary | ICD-10-CM | POA: Diagnosis not present

## 2016-03-02 DIAGNOSIS — H35372 Puckering of macula, left eye: Secondary | ICD-10-CM | POA: Diagnosis not present

## 2016-03-02 DIAGNOSIS — E119 Type 2 diabetes mellitus without complications: Secondary | ICD-10-CM | POA: Diagnosis not present

## 2016-04-06 DIAGNOSIS — Z7984 Long term (current) use of oral hypoglycemic drugs: Secondary | ICD-10-CM | POA: Diagnosis not present

## 2016-04-06 DIAGNOSIS — I10 Essential (primary) hypertension: Secondary | ICD-10-CM | POA: Diagnosis not present

## 2016-04-06 DIAGNOSIS — E11319 Type 2 diabetes mellitus with unspecified diabetic retinopathy without macular edema: Secondary | ICD-10-CM | POA: Diagnosis not present

## 2016-04-06 DIAGNOSIS — Z1389 Encounter for screening for other disorder: Secondary | ICD-10-CM | POA: Diagnosis not present

## 2016-04-06 DIAGNOSIS — K219 Gastro-esophageal reflux disease without esophagitis: Secondary | ICD-10-CM | POA: Diagnosis not present

## 2016-04-06 DIAGNOSIS — E782 Mixed hyperlipidemia: Secondary | ICD-10-CM | POA: Diagnosis not present

## 2016-04-06 DIAGNOSIS — G4700 Insomnia, unspecified: Secondary | ICD-10-CM | POA: Diagnosis not present

## 2016-04-10 DIAGNOSIS — L570 Actinic keratosis: Secondary | ICD-10-CM | POA: Diagnosis not present

## 2016-04-10 DIAGNOSIS — Z85828 Personal history of other malignant neoplasm of skin: Secondary | ICD-10-CM | POA: Diagnosis not present

## 2016-04-10 DIAGNOSIS — L821 Other seborrheic keratosis: Secondary | ICD-10-CM | POA: Diagnosis not present

## 2016-04-10 DIAGNOSIS — D485 Neoplasm of uncertain behavior of skin: Secondary | ICD-10-CM | POA: Diagnosis not present

## 2016-04-11 DIAGNOSIS — E119 Type 2 diabetes mellitus without complications: Secondary | ICD-10-CM | POA: Diagnosis not present

## 2016-04-11 DIAGNOSIS — H353132 Nonexudative age-related macular degeneration, bilateral, intermediate dry stage: Secondary | ICD-10-CM | POA: Diagnosis not present

## 2016-04-11 DIAGNOSIS — H35372 Puckering of macula, left eye: Secondary | ICD-10-CM | POA: Diagnosis not present

## 2016-04-11 DIAGNOSIS — Z961 Presence of intraocular lens: Secondary | ICD-10-CM | POA: Diagnosis not present

## 2016-04-11 DIAGNOSIS — H53411 Scotoma involving central area, right eye: Secondary | ICD-10-CM | POA: Diagnosis not present

## 2016-04-11 DIAGNOSIS — H35363 Drusen (degenerative) of macula, bilateral: Secondary | ICD-10-CM | POA: Diagnosis not present

## 2016-04-19 DIAGNOSIS — H524 Presbyopia: Secondary | ICD-10-CM | POA: Diagnosis not present

## 2016-04-19 DIAGNOSIS — E119 Type 2 diabetes mellitus without complications: Secondary | ICD-10-CM | POA: Diagnosis not present

## 2016-04-19 DIAGNOSIS — H353132 Nonexudative age-related macular degeneration, bilateral, intermediate dry stage: Secondary | ICD-10-CM | POA: Diagnosis not present

## 2016-04-19 DIAGNOSIS — H04123 Dry eye syndrome of bilateral lacrimal glands: Secondary | ICD-10-CM | POA: Diagnosis not present

## 2016-07-09 DIAGNOSIS — M25562 Pain in left knee: Secondary | ICD-10-CM | POA: Diagnosis not present

## 2016-08-15 DIAGNOSIS — Z23 Encounter for immunization: Secondary | ICD-10-CM | POA: Diagnosis not present

## 2016-09-28 DIAGNOSIS — D045 Carcinoma in situ of skin of trunk: Secondary | ICD-10-CM | POA: Diagnosis not present

## 2016-09-28 DIAGNOSIS — L821 Other seborrheic keratosis: Secondary | ICD-10-CM | POA: Diagnosis not present

## 2016-09-28 DIAGNOSIS — L570 Actinic keratosis: Secondary | ICD-10-CM | POA: Diagnosis not present

## 2016-09-28 DIAGNOSIS — Z85820 Personal history of malignant melanoma of skin: Secondary | ICD-10-CM | POA: Diagnosis not present

## 2016-09-28 DIAGNOSIS — C44519 Basal cell carcinoma of skin of other part of trunk: Secondary | ICD-10-CM | POA: Diagnosis not present

## 2016-09-28 DIAGNOSIS — L920 Granuloma annulare: Secondary | ICD-10-CM | POA: Diagnosis not present

## 2016-09-28 DIAGNOSIS — D485 Neoplasm of uncertain behavior of skin: Secondary | ICD-10-CM | POA: Diagnosis not present

## 2016-09-28 DIAGNOSIS — C44329 Squamous cell carcinoma of skin of other parts of face: Secondary | ICD-10-CM | POA: Diagnosis not present

## 2016-09-28 DIAGNOSIS — Z85828 Personal history of other malignant neoplasm of skin: Secondary | ICD-10-CM | POA: Diagnosis not present

## 2016-10-04 DIAGNOSIS — M1990 Unspecified osteoarthritis, unspecified site: Secondary | ICD-10-CM | POA: Diagnosis not present

## 2016-10-04 DIAGNOSIS — E669 Obesity, unspecified: Secondary | ICD-10-CM | POA: Diagnosis not present

## 2016-10-04 DIAGNOSIS — Z6835 Body mass index (BMI) 35.0-35.9, adult: Secondary | ICD-10-CM | POA: Diagnosis not present

## 2016-10-04 DIAGNOSIS — Z0000 Encounter for general adult medical examination without abnormal findings: Secondary | ICD-10-CM | POA: Diagnosis not present

## 2016-10-04 DIAGNOSIS — Z7984 Long term (current) use of oral hypoglycemic drugs: Secondary | ICD-10-CM | POA: Diagnosis not present

## 2016-10-04 DIAGNOSIS — Z1389 Encounter for screening for other disorder: Secondary | ICD-10-CM | POA: Diagnosis not present

## 2016-10-04 DIAGNOSIS — I10 Essential (primary) hypertension: Secondary | ICD-10-CM | POA: Diagnosis not present

## 2016-10-04 DIAGNOSIS — E782 Mixed hyperlipidemia: Secondary | ICD-10-CM | POA: Diagnosis not present

## 2016-10-04 DIAGNOSIS — E11319 Type 2 diabetes mellitus with unspecified diabetic retinopathy without macular edema: Secondary | ICD-10-CM | POA: Diagnosis not present

## 2016-10-04 DIAGNOSIS — Z23 Encounter for immunization: Secondary | ICD-10-CM | POA: Diagnosis not present

## 2016-10-04 DIAGNOSIS — K219 Gastro-esophageal reflux disease without esophagitis: Secondary | ICD-10-CM | POA: Diagnosis not present

## 2016-10-04 DIAGNOSIS — N183 Chronic kidney disease, stage 3 (moderate): Secondary | ICD-10-CM | POA: Diagnosis not present

## 2016-10-08 DIAGNOSIS — M1712 Unilateral primary osteoarthritis, left knee: Secondary | ICD-10-CM | POA: Diagnosis not present

## 2016-10-08 DIAGNOSIS — H903 Sensorineural hearing loss, bilateral: Secondary | ICD-10-CM | POA: Diagnosis not present

## 2016-10-11 DIAGNOSIS — C44329 Squamous cell carcinoma of skin of other parts of face: Secondary | ICD-10-CM | POA: Diagnosis not present

## 2016-10-11 DIAGNOSIS — Z85828 Personal history of other malignant neoplasm of skin: Secondary | ICD-10-CM | POA: Diagnosis not present

## 2016-10-19 DIAGNOSIS — M1712 Unilateral primary osteoarthritis, left knee: Secondary | ICD-10-CM | POA: Diagnosis not present

## 2016-10-23 DIAGNOSIS — Z0000 Encounter for general adult medical examination without abnormal findings: Secondary | ICD-10-CM | POA: Diagnosis not present

## 2016-10-23 DIAGNOSIS — Z1211 Encounter for screening for malignant neoplasm of colon: Secondary | ICD-10-CM | POA: Diagnosis not present

## 2017-02-14 DIAGNOSIS — M1712 Unilateral primary osteoarthritis, left knee: Secondary | ICD-10-CM | POA: Diagnosis not present

## 2017-02-14 DIAGNOSIS — Z6835 Body mass index (BMI) 35.0-35.9, adult: Secondary | ICD-10-CM | POA: Diagnosis not present

## 2017-02-14 DIAGNOSIS — M150 Primary generalized (osteo)arthritis: Secondary | ICD-10-CM | POA: Diagnosis not present

## 2017-02-14 DIAGNOSIS — M25562 Pain in left knee: Secondary | ICD-10-CM | POA: Diagnosis not present

## 2017-03-07 DIAGNOSIS — J301 Allergic rhinitis due to pollen: Secondary | ICD-10-CM | POA: Diagnosis not present

## 2017-04-04 DIAGNOSIS — Z961 Presence of intraocular lens: Secondary | ICD-10-CM | POA: Diagnosis not present

## 2017-04-04 DIAGNOSIS — H353132 Nonexudative age-related macular degeneration, bilateral, intermediate dry stage: Secondary | ICD-10-CM | POA: Diagnosis not present

## 2017-04-04 DIAGNOSIS — E119 Type 2 diabetes mellitus without complications: Secondary | ICD-10-CM | POA: Diagnosis not present

## 2017-04-04 DIAGNOSIS — H04123 Dry eye syndrome of bilateral lacrimal glands: Secondary | ICD-10-CM | POA: Diagnosis not present

## 2017-04-04 DIAGNOSIS — H524 Presbyopia: Secondary | ICD-10-CM | POA: Diagnosis not present

## 2017-04-08 DIAGNOSIS — N183 Chronic kidney disease, stage 3 (moderate): Secondary | ICD-10-CM | POA: Diagnosis not present

## 2017-04-08 DIAGNOSIS — H9313 Tinnitus, bilateral: Secondary | ICD-10-CM | POA: Diagnosis not present

## 2017-04-08 DIAGNOSIS — E782 Mixed hyperlipidemia: Secondary | ICD-10-CM | POA: Diagnosis not present

## 2017-04-08 DIAGNOSIS — Z6835 Body mass index (BMI) 35.0-35.9, adult: Secondary | ICD-10-CM | POA: Diagnosis not present

## 2017-04-08 DIAGNOSIS — E11319 Type 2 diabetes mellitus with unspecified diabetic retinopathy without macular edema: Secondary | ICD-10-CM | POA: Diagnosis not present

## 2017-04-08 DIAGNOSIS — E669 Obesity, unspecified: Secondary | ICD-10-CM | POA: Diagnosis not present

## 2017-04-08 DIAGNOSIS — I10 Essential (primary) hypertension: Secondary | ICD-10-CM | POA: Diagnosis not present

## 2017-04-08 DIAGNOSIS — E1165 Type 2 diabetes mellitus with hyperglycemia: Secondary | ICD-10-CM | POA: Diagnosis not present

## 2017-04-08 DIAGNOSIS — K219 Gastro-esophageal reflux disease without esophagitis: Secondary | ICD-10-CM | POA: Diagnosis not present

## 2017-04-08 DIAGNOSIS — M1990 Unspecified osteoarthritis, unspecified site: Secondary | ICD-10-CM | POA: Diagnosis not present

## 2017-04-22 DIAGNOSIS — L570 Actinic keratosis: Secondary | ICD-10-CM | POA: Diagnosis not present

## 2017-04-22 DIAGNOSIS — C44321 Squamous cell carcinoma of skin of nose: Secondary | ICD-10-CM | POA: Diagnosis not present

## 2017-04-22 DIAGNOSIS — D485 Neoplasm of uncertain behavior of skin: Secondary | ICD-10-CM | POA: Diagnosis not present

## 2017-04-22 DIAGNOSIS — L218 Other seborrheic dermatitis: Secondary | ICD-10-CM | POA: Diagnosis not present

## 2017-04-22 DIAGNOSIS — L820 Inflamed seborrheic keratosis: Secondary | ICD-10-CM | POA: Diagnosis not present

## 2017-04-22 DIAGNOSIS — L821 Other seborrheic keratosis: Secondary | ICD-10-CM | POA: Diagnosis not present

## 2017-04-22 DIAGNOSIS — Z85828 Personal history of other malignant neoplasm of skin: Secondary | ICD-10-CM | POA: Diagnosis not present

## 2017-04-22 DIAGNOSIS — C44329 Squamous cell carcinoma of skin of other parts of face: Secondary | ICD-10-CM | POA: Diagnosis not present

## 2017-04-22 DIAGNOSIS — L920 Granuloma annulare: Secondary | ICD-10-CM | POA: Diagnosis not present

## 2017-05-14 DIAGNOSIS — Z85828 Personal history of other malignant neoplasm of skin: Secondary | ICD-10-CM | POA: Diagnosis not present

## 2017-05-14 DIAGNOSIS — C4402 Squamous cell carcinoma of skin of lip: Secondary | ICD-10-CM | POA: Diagnosis not present

## 2017-06-08 DIAGNOSIS — M1712 Unilateral primary osteoarthritis, left knee: Secondary | ICD-10-CM | POA: Diagnosis not present

## 2017-07-27 DIAGNOSIS — Z23 Encounter for immunization: Secondary | ICD-10-CM | POA: Diagnosis not present

## 2017-09-30 DIAGNOSIS — M1712 Unilateral primary osteoarthritis, left knee: Secondary | ICD-10-CM | POA: Diagnosis not present

## 2017-10-03 DIAGNOSIS — D1801 Hemangioma of skin and subcutaneous tissue: Secondary | ICD-10-CM | POA: Diagnosis not present

## 2017-10-03 DIAGNOSIS — L578 Other skin changes due to chronic exposure to nonionizing radiation: Secondary | ICD-10-CM | POA: Diagnosis not present

## 2017-10-03 DIAGNOSIS — D692 Other nonthrombocytopenic purpura: Secondary | ICD-10-CM | POA: Diagnosis not present

## 2017-10-03 DIAGNOSIS — L570 Actinic keratosis: Secondary | ICD-10-CM | POA: Diagnosis not present

## 2017-10-03 DIAGNOSIS — Z85828 Personal history of other malignant neoplasm of skin: Secondary | ICD-10-CM | POA: Diagnosis not present

## 2017-10-03 DIAGNOSIS — L821 Other seborrheic keratosis: Secondary | ICD-10-CM | POA: Diagnosis not present

## 2017-10-09 DIAGNOSIS — M1712 Unilateral primary osteoarthritis, left knee: Secondary | ICD-10-CM | POA: Diagnosis not present

## 2017-10-15 DIAGNOSIS — E7800 Pure hypercholesterolemia, unspecified: Secondary | ICD-10-CM | POA: Diagnosis not present

## 2017-10-15 DIAGNOSIS — Z0000 Encounter for general adult medical examination without abnormal findings: Secondary | ICD-10-CM | POA: Diagnosis not present

## 2017-10-15 DIAGNOSIS — M1990 Unspecified osteoarthritis, unspecified site: Secondary | ICD-10-CM | POA: Diagnosis not present

## 2017-10-15 DIAGNOSIS — I10 Essential (primary) hypertension: Secondary | ICD-10-CM | POA: Diagnosis not present

## 2017-10-15 DIAGNOSIS — N183 Chronic kidney disease, stage 3 (moderate): Secondary | ICD-10-CM | POA: Diagnosis not present

## 2017-10-15 DIAGNOSIS — E782 Mixed hyperlipidemia: Secondary | ICD-10-CM | POA: Diagnosis not present

## 2017-10-15 DIAGNOSIS — E08319 Diabetes mellitus due to underlying condition with unspecified diabetic retinopathy without macular edema: Secondary | ICD-10-CM | POA: Diagnosis not present

## 2017-10-15 DIAGNOSIS — K219 Gastro-esophageal reflux disease without esophagitis: Secondary | ICD-10-CM | POA: Diagnosis not present

## 2017-10-15 DIAGNOSIS — Z7984 Long term (current) use of oral hypoglycemic drugs: Secondary | ICD-10-CM | POA: Diagnosis not present

## 2017-10-15 DIAGNOSIS — E11319 Type 2 diabetes mellitus with unspecified diabetic retinopathy without macular edema: Secondary | ICD-10-CM | POA: Diagnosis not present

## 2017-10-15 DIAGNOSIS — Z1389 Encounter for screening for other disorder: Secondary | ICD-10-CM | POA: Diagnosis not present

## 2017-10-16 DIAGNOSIS — M1712 Unilateral primary osteoarthritis, left knee: Secondary | ICD-10-CM | POA: Diagnosis not present

## 2018-01-13 DIAGNOSIS — J069 Acute upper respiratory infection, unspecified: Secondary | ICD-10-CM | POA: Diagnosis not present

## 2018-03-10 DIAGNOSIS — E119 Type 2 diabetes mellitus without complications: Secondary | ICD-10-CM | POA: Diagnosis not present

## 2018-03-10 DIAGNOSIS — H35372 Puckering of macula, left eye: Secondary | ICD-10-CM | POA: Diagnosis not present

## 2018-03-10 DIAGNOSIS — H353132 Nonexudative age-related macular degeneration, bilateral, intermediate dry stage: Secondary | ICD-10-CM | POA: Diagnosis not present

## 2018-03-31 DIAGNOSIS — D692 Other nonthrombocytopenic purpura: Secondary | ICD-10-CM | POA: Diagnosis not present

## 2018-03-31 DIAGNOSIS — D485 Neoplasm of uncertain behavior of skin: Secondary | ICD-10-CM | POA: Diagnosis not present

## 2018-03-31 DIAGNOSIS — C44612 Basal cell carcinoma of skin of right upper limb, including shoulder: Secondary | ICD-10-CM | POA: Diagnosis not present

## 2018-03-31 DIAGNOSIS — L821 Other seborrheic keratosis: Secondary | ICD-10-CM | POA: Diagnosis not present

## 2018-03-31 DIAGNOSIS — D1801 Hemangioma of skin and subcutaneous tissue: Secondary | ICD-10-CM | POA: Diagnosis not present

## 2018-03-31 DIAGNOSIS — L812 Freckles: Secondary | ICD-10-CM | POA: Diagnosis not present

## 2018-03-31 DIAGNOSIS — L920 Granuloma annulare: Secondary | ICD-10-CM | POA: Diagnosis not present

## 2018-03-31 DIAGNOSIS — Z85828 Personal history of other malignant neoplasm of skin: Secondary | ICD-10-CM | POA: Diagnosis not present

## 2018-03-31 DIAGNOSIS — L570 Actinic keratosis: Secondary | ICD-10-CM | POA: Diagnosis not present

## 2018-04-08 DIAGNOSIS — M1990 Unspecified osteoarthritis, unspecified site: Secondary | ICD-10-CM | POA: Diagnosis not present

## 2018-04-08 DIAGNOSIS — I10 Essential (primary) hypertension: Secondary | ICD-10-CM | POA: Diagnosis not present

## 2018-04-08 DIAGNOSIS — G4700 Insomnia, unspecified: Secondary | ICD-10-CM | POA: Diagnosis not present

## 2018-04-08 DIAGNOSIS — E782 Mixed hyperlipidemia: Secondary | ICD-10-CM | POA: Diagnosis not present

## 2018-04-08 DIAGNOSIS — E08319 Diabetes mellitus due to underlying condition with unspecified diabetic retinopathy without macular edema: Secondary | ICD-10-CM | POA: Diagnosis not present

## 2018-04-08 DIAGNOSIS — K219 Gastro-esophageal reflux disease without esophagitis: Secondary | ICD-10-CM | POA: Diagnosis not present

## 2018-05-26 DIAGNOSIS — Z85828 Personal history of other malignant neoplasm of skin: Secondary | ICD-10-CM | POA: Diagnosis not present

## 2018-05-26 DIAGNOSIS — S80811A Abrasion, right lower leg, initial encounter: Secondary | ICD-10-CM | POA: Diagnosis not present

## 2018-06-12 DIAGNOSIS — H524 Presbyopia: Secondary | ICD-10-CM | POA: Diagnosis not present

## 2018-06-12 DIAGNOSIS — E119 Type 2 diabetes mellitus without complications: Secondary | ICD-10-CM | POA: Diagnosis not present

## 2018-06-12 DIAGNOSIS — H04123 Dry eye syndrome of bilateral lacrimal glands: Secondary | ICD-10-CM | POA: Diagnosis not present

## 2018-06-12 DIAGNOSIS — H353132 Nonexudative age-related macular degeneration, bilateral, intermediate dry stage: Secondary | ICD-10-CM | POA: Diagnosis not present

## 2018-06-12 DIAGNOSIS — H5319 Other subjective visual disturbances: Secondary | ICD-10-CM | POA: Diagnosis not present

## 2018-07-07 DIAGNOSIS — M1712 Unilateral primary osteoarthritis, left knee: Secondary | ICD-10-CM | POA: Diagnosis not present

## 2018-08-05 DIAGNOSIS — L609 Nail disorder, unspecified: Secondary | ICD-10-CM | POA: Diagnosis not present

## 2018-08-05 DIAGNOSIS — M1712 Unilateral primary osteoarthritis, left knee: Secondary | ICD-10-CM | POA: Diagnosis not present

## 2018-08-05 DIAGNOSIS — M19072 Primary osteoarthritis, left ankle and foot: Secondary | ICD-10-CM | POA: Diagnosis not present

## 2018-08-05 DIAGNOSIS — M7750 Other enthesopathy of unspecified foot: Secondary | ICD-10-CM | POA: Diagnosis not present

## 2018-08-05 DIAGNOSIS — M19071 Primary osteoarthritis, right ankle and foot: Secondary | ICD-10-CM | POA: Diagnosis not present

## 2018-08-19 DIAGNOSIS — Z23 Encounter for immunization: Secondary | ICD-10-CM | POA: Diagnosis not present

## 2018-09-04 DIAGNOSIS — M19071 Primary osteoarthritis, right ankle and foot: Secondary | ICD-10-CM | POA: Diagnosis not present

## 2018-09-04 DIAGNOSIS — M2012 Hallux valgus (acquired), left foot: Secondary | ICD-10-CM | POA: Diagnosis not present

## 2018-09-04 DIAGNOSIS — M2011 Hallux valgus (acquired), right foot: Secondary | ICD-10-CM | POA: Diagnosis not present

## 2018-09-04 DIAGNOSIS — M19072 Primary osteoarthritis, left ankle and foot: Secondary | ICD-10-CM | POA: Diagnosis not present

## 2018-09-17 DIAGNOSIS — R0602 Shortness of breath: Secondary | ICD-10-CM | POA: Diagnosis not present

## 2018-09-17 DIAGNOSIS — I499 Cardiac arrhythmia, unspecified: Secondary | ICD-10-CM | POA: Diagnosis not present

## 2018-09-17 DIAGNOSIS — I4891 Unspecified atrial fibrillation: Secondary | ICD-10-CM | POA: Diagnosis not present

## 2018-09-30 DIAGNOSIS — L821 Other seborrheic keratosis: Secondary | ICD-10-CM | POA: Diagnosis not present

## 2018-09-30 DIAGNOSIS — L812 Freckles: Secondary | ICD-10-CM | POA: Diagnosis not present

## 2018-09-30 DIAGNOSIS — Z85828 Personal history of other malignant neoplasm of skin: Secondary | ICD-10-CM | POA: Diagnosis not present

## 2018-09-30 DIAGNOSIS — D1801 Hemangioma of skin and subcutaneous tissue: Secondary | ICD-10-CM | POA: Diagnosis not present

## 2018-09-30 DIAGNOSIS — L570 Actinic keratosis: Secondary | ICD-10-CM | POA: Diagnosis not present

## 2018-09-30 DIAGNOSIS — D692 Other nonthrombocytopenic purpura: Secondary | ICD-10-CM | POA: Diagnosis not present

## 2018-09-30 NOTE — Progress Notes (Signed)
Cardiology Office Note   Date:  10/01/2018   ID:  [name], DOB [birth year], MRN [id number]  PCP:  Wenda Low, MD  Cardiologist:   No primary care provider on file. Referring:  Wenda Low, MD   No chief complaint on file.     History of Present Illness: [name] is a 84 y.o. male who presents for evaluation of atrial fibrillation.  He lives part-time in [place].  He noticed while he was there that he was having some increased shortness of breath.  He had some decreased exercise tolerance.  He really did not feel any tachypalpitations.  He did have a tiredness and he felt like his legs were heavier.  When he came back up here and was seen by his primary provider he was noted to be in atrial fibrillation.  He was started on Eliquis and metoprolol.  However, he developed dark urine and actually brought in a bottle that today.  He stopped the Eliquis and his dark urine has cleared up.  He is reluctant to take Eliquis again.  He does not notice rhythm.  He is not having any presyncope or syncope.  He is not having any chest pressure, neck or arm discomfort.  He is never had any cardiac issues before other than some skipped palpitations and it sounds like he wore a Holter years ago.   Past Medical History:  Diagnosis Date  . A-fib ([place])   . Chondrocalcinosis of knee   . DM (diabetes mellitus) ([name])   . ED (erectile dysfunction)   . Elevated LFTs   . Fall 2013  . GERD (gastroesophageal reflux disease)   . H. pylori infection 1992  . Hearing aid worn   . Hypertension 1994  . Insomnia   . Irregular heart beat   . Lumbar radiculopathy 2010  . Melanoma ([place])   . OA (osteoarthritis) of knee   . Prostatism   . Rib fracture 2013  . SOB (shortness of breath)   . Tinnitus   . Trochanteric bursitis 2010  . Urinary incontinence        Current Outpatient Medications  Medication Sig Dispense Refill  . Acetaminophen (TYLENOL ARTHRITIS PAIN PO) Take by mouth. TAKE  ONE TABLETS EVERY NIGHT    . apixaban (ELIQUIS) 5 MG TABS tablet Take 5 mg by mouth 2 (two) times daily.    Marland Kitchen atorvastatin (LIPITOR) 40 MG tablet Take 40 mg by mouth daily.    . Cyanocobalamin (VITAMIN B 12 PO) Take 1,000 mg by mouth.    . ENALAPRIL MALEATE PO Take 20 mg by mouth.    . fenofibrate (TRICOR) 145 MG tablet Take 145 mg by mouth daily.    Marland Kitchen glipiZIDE (GLUCOTROL) 5 MG tablet Take 2.5 mg by mouth daily before breakfast.    . hydrochlorothiazide (HYDRODIURIL) 25 MG tablet Take 25 mg by mouth daily.    . metFORMIN (GLUCOPHAGE) 500 MG tablet Take 500 mg by mouth 2 (two) times daily with a meal.    . METOPROLOL SUCCINATE ER PO Take 25 mg by mouth daily.    . MULTIPLE VITAMIN PO Take by mouth daily.    . Multiple Vitamins-Minerals (RA VISION-VITE PRESERVE PO) Take by mouth 2 (two) times daily.    . naproxen sodium (ALEVE) 220 MG tablet Take 220 mg by mouth every 12 (twelve) hours as needed.    . pantoprazole (PROTONIX) 40 MG tablet Take 40 mg by mouth daily.    Marland Kitchen PIOGLITAZONE  HCL PO Take 40 mg by mouth daily.    Marland Kitchen zolpidem (AMBIEN) 10 MG tablet Take 10 mg by mouth at bedtime as needed for sleep.     No current facility-administered medications for this visit.     Allergies:   Penicillins    Social History:  The patient  reports that he has been smoking cigars. He has never used smokeless tobacco. He reports that he drinks alcohol. He reports that he does not use drugs.   Family History:  The patient's family history includes Diabetes in his father; Pancreatic cancer in his sister; Prostate cancer in his brother.    ROS:  Please see the history of present illness.   Otherwise, review of systems are positive for erectile dysfunction, reflux joint pains.   All other systems are reviewed and negative.    PHYSICAL EXAM: VS:  BP (!) 132/58   Pulse 84   Ht 5\' 11"  (1.803 m)   Wt 237 lb (107.5 kg)   BMI 33.05 kg/m  , BMI Body mass index is 33.05 kg/m. GENERAL:  Well  appearing HEENT:  Pupils equal round and reactive, fundi not visualized, oral mucosa unremarkable NECK:  No jugular venous distention, waveform within normal limits, carotid upstroke brisk and symmetric, no bruits, no thyromegaly LYMPHATICS:  No cervical, inguinal adenopathy LUNGS:  Clear to auscultation bilaterally BACK:  No CVA tenderness CHEST:  Unremarkable HEART:  PMI not displaced or sustained,S1 and S2 within normal limits, no S3,  no clicks, no rubs, no murmurs, irregular ABD:  Flat, positive bowel sounds normal in frequency in pitch, no bruits, no rebound, no guarding, no midline pulsatile mass, no hepatomegaly, no splenomegaly EXT:  2 plus pulses throughout, trace edema, no cyanosis no clubbing SKIN:  No rashes no nodules NEURO:  Cranial nerves II through XII grossly intact, motor grossly intact throughout PSYCH:  Cognitively intact, oriented to person place and time    EKG:  EKG is not ordered today. The ekg ordered 09/17/18 atrial fibrillation, rate 92, axis within normal limits, intervals within normal limits, no acute ST-T wave changes.   Recent Labs: No results found for requested labs within last 8760 hours.    Lipid Panel No results found for: CHOL, TRIG, HDL, CHOLHDL, VLDL, LDLCALC, LDLDIRECT    Wt Readings from Last 3 Encounters:  10/01/18 237 lb (107.5 kg)      Other studies Reviewed: Additional studies/ records that were reviewed today include: Office records. Review of the above records demonstrates:  Please see elsewhere in the note.     ASSESSMENT AND PLAN:  ATRIAL FIB: I had a long conversation with the patient and his wife about this.  Today he had some dark urine with him and I am going to send this off to see if there is any hematuria.  He is holding off on the Eliquis for now.  If this was not hematuria I would suggest he restart the Eliquis and we watch his urine.  If it is hematuria he is going to need to see a urologist before starting back on a  blood thinner.  He seemed to be very reluctant to restart Eliquis at this time to make his urine dark and I tried to explain that his risk of stroke is about 4% (Mr. [name] has a CHA2DS2 - VASc score of 4) if he is not on a blood thinner.  Ultimately I think he needs to have cardioversion.  I am going to do a  24-hour Holter to make sure this is persistent atrial fibrillation and that his rate is controlled.  I will be checking an echocardiogram.  Once I get him on 3 weeks of consistent blood thinner I do think he needs cardioversion his he is likely symptomatic with this with his increased fatigue and decreased exercise tolerance.  I do note that his thyroid was checked recently and it was normal.  Electrolytes were normal.  Ultimately he will have to have some of his care in [place] as he is going back there at the end of the month and I will try to arrange follow-up with a cardiologist in [place].  I did call and speak with Wenda Low, MD about this plan.   DYSPNEA: His BNP was slightly elevated.  I will be checking the echo as above.  I suspect some element of diastolic dysfunction along with age and deconditioning and weight.  DARK URINE : Questionable hematuria and this will be sent off.  He does report that his urine is now clear.  ANEMIA: This is noted on his office records.  However, he is not have any active symptoms of bleeding other than the urine as above.  We will follow this.   Current medicines are reviewed at length with the patient today.  The patient does not have concerns regarding medicines.  The following changes have been made:  no change  Labs/ tests ordered today include:   Orders Placed This Encounter  Procedures  . Urinalysis  . HOLTER MONITOR - 24 HOUR  . EKG 12-Lead  . ECHOCARDIOGRAM COMPLETE     Disposition:   FU with me in 2 weeks.      Signed, Minus Breeding, MD  10/01/2018 5:20 PM    [facility]

## 2018-10-01 ENCOUNTER — Ambulatory Visit (INDEPENDENT_AMBULATORY_CARE_PROVIDER_SITE_OTHER): Payer: Medicare Other | Admitting: Cardiology

## 2018-10-01 VITALS — BP 132/58 | HR 84 | Ht 71.0 in | Wt 237.0 lb

## 2018-10-01 DIAGNOSIS — R319 Hematuria, unspecified: Secondary | ICD-10-CM

## 2018-10-01 DIAGNOSIS — I4891 Unspecified atrial fibrillation: Secondary | ICD-10-CM

## 2018-10-01 NOTE — Patient Instructions (Signed)
Medication Instructions:  Continue current medications  If you need a refill on your cardiac medications before your next appointment, please call your pharmacy.  Labwork: Urinalysis   If you have labs (blood work) drawn today and your tests are completely normal, you will receive your results only by: Marland Kitchen MyChart Message (if you have MyChart) OR . A paper copy in the mail If you have any lab test that is abnormal or we need to change your treatment, we will call you to review the results.  Testing/Procedures: Your physician has recommended that you wear a 24 hour holter monitor. Holter monitors are medical devices that record the heart's electrical activity. Doctors most often use these monitors to diagnose arrhythmias. Arrhythmias are problems with the speed or rhythm of the heartbeat. The monitor is a small, portable device. You can wear one while you do your normal daily activities. This is usually used to diagnose what is causing palpitations/syncope (passing out).  Your physician has requested that you have an echocardiogram. Echocardiography is a painless test that uses sound waves to create images of your heart. It provides your doctor with information about the size and shape of your heart and how well your heart's chambers and valves are working. This procedure takes approximately one hour. There are no restrictions for this procedure.  Follow-Up: . You will need a follow up appointment in 2 Weeks.    At [facility], you and your health needs are our priority.  As part of our continuing mission to provide you with exceptional heart care, we have created designated Provider Care Teams.  These Care Teams include your primary Cardiologist (physician) and Advanced Practice Providers (APPs -  Physician Assistants and Nurse Practitioners) who all work together to provide you with the care you need, when you need it.   Thank you for choosing [facility] at [facility]!!

## 2018-10-02 ENCOUNTER — Telehealth: Payer: Self-pay | Admitting: *Deleted

## 2018-10-02 DIAGNOSIS — R8290 Unspecified abnormal findings in urine: Secondary | ICD-10-CM

## 2018-10-02 LAB — URINALYSIS
Bilirubin, UA: NEGATIVE
Glucose, UA: NEGATIVE
Ketones, UA: NEGATIVE
Nitrite, UA: NEGATIVE
Specific Gravity, UA: 1.014 (ref 1.005–1.030)
Urobilinogen, Ur: 1 mg/dL (ref 0.2–1.0)
pH, UA: 6.5 (ref 5.0–7.5)

## 2018-10-02 NOTE — Telephone Encounter (Signed)
-----   Message from Minus Breeding, MD sent at 10/02/2018  1:18 PM EST ----- There is blood in his urine.  I would like for him to see [facility] and have evaluation of this before starting back on blood thinners.  Discussed with Wenda Low, MD.  Please call patient and help him to arrange the new patient appt with Urology.

## 2018-10-02 NOTE — Telephone Encounter (Signed)
Pt aware of his blood work, referral send to [facility].

## 2018-10-03 ENCOUNTER — Ambulatory Visit (HOSPITAL_COMMUNITY): Payer: Medicare Other | Attending: Cardiology

## 2018-10-03 ENCOUNTER — Other Ambulatory Visit: Payer: Self-pay

## 2018-10-03 DIAGNOSIS — I4891 Unspecified atrial fibrillation: Secondary | ICD-10-CM

## 2018-10-08 ENCOUNTER — Encounter: Payer: Self-pay | Admitting: Cardiology

## 2018-10-08 ENCOUNTER — Ambulatory Visit (INDEPENDENT_AMBULATORY_CARE_PROVIDER_SITE_OTHER): Payer: Medicare Other

## 2018-10-08 DIAGNOSIS — I4891 Unspecified atrial fibrillation: Secondary | ICD-10-CM | POA: Diagnosis not present

## 2018-10-08 DIAGNOSIS — R310 Gross hematuria: Secondary | ICD-10-CM | POA: Diagnosis not present

## 2018-10-10 DIAGNOSIS — R310 Gross hematuria: Secondary | ICD-10-CM | POA: Diagnosis not present

## 2018-10-13 DIAGNOSIS — R310 Gross hematuria: Secondary | ICD-10-CM | POA: Diagnosis not present

## 2018-10-13 DIAGNOSIS — N323 Diverticulum of bladder: Secondary | ICD-10-CM | POA: Diagnosis not present

## 2018-10-14 ENCOUNTER — Other Ambulatory Visit (HOSPITAL_COMMUNITY): Payer: Medicare Other

## 2018-10-15 NOTE — Progress Notes (Signed)
Cardiology Office Note   Date:  10/16/2018   ID:  [name], DOB [birth year], MRN [id number]  PCP:  Roger Low, MD  Cardiologist:   No primary care provider on file. Referring:  Roger Low, MD   Chief Complaint  Patient presents with  . Atrial Fibrillation      History of Present Illness: [name] is a 84 y.o. male who presents for evaluation of atrial fibrillation.   I saw him recently for atrial fib.  He was on [name] and metoprolol.  However, he developed dark urine and actually brought in a bottle.  This was positive for blood and I suggested that he restart [name] which he had stopped and see a urologist.  He returns for follow up.  Since he was last seen he saw urologist.  He did have some diverticulum and some erythema.  This could have been the source of some bleeding in his bladder.  He is potentially going to get follow-up study but he is going to be moving to [place] soon where he spends part time.  He is otherwise been feeling well.  He had rate control on his Holter that he wore which demonstrated that his atrial fibrillation is persistent.  He had an echocardiogram which demonstrated some apical akinesis and slightly Hernandez ejection fraction of 50 to 55%.  He feels well.  Is not had any further hematuria.  He does not want to take [name] again.   Past Medical History:  Diagnosis Date  . A-fib ([place])   . Chondrocalcinosis of knee   . DM (diabetes mellitus) ([place])   . ED (erectile dysfunction)   . Elevated LFTs   . Fall 2013  . GERD (gastroesophageal reflux disease)   . H. pylori infection 1992  . Hearing aid worn   . Hypertension 1994  . Insomnia   . Irregular heart beat   . Lumbar radiculopathy 2010  . Melanoma ([place])   . OA (osteoarthritis) of knee   . Prostatism   . Rib fracture 2013  . SOB (shortness of breath)   . Tinnitus   . Trochanteric bursitis 2010  . Urinary incontinence        Current Outpatient Medications  Medication Sig  Dispense Refill  . Acetaminophen (TYLENOL ARTHRITIS PAIN PO) Take by mouth. TAKE ONE TABLETS EVERY NIGHT    . atorvastatin (LIPITOR) 40 MG tablet Take 40 mg by mouth daily.    . Cyanocobalamin (VITAMIN B 12 PO) Take 1,000 mg by mouth.    . ENALAPRIL MALEATE PO Take 20 mg by mouth.    . fenofibrate (TRICOR) 145 MG tablet Take 145 mg by mouth daily.    Marland Kitchen glipiZIDE (GLUCOTROL) 5 MG tablet Take 2.5 mg by mouth daily before breakfast.    . hydrochlorothiazide (HYDRODIURIL) 25 MG tablet Take 25 mg by mouth daily.    . metFORMIN (GLUCOPHAGE) 500 MG tablet Take 500 mg by mouth 2 (two) times daily with a meal.    . MULTIPLE VITAMIN PO Take by mouth daily.    . Multiple Vitamins-Minerals (RA VISION-VITE PRESERVE PO) Take by mouth 2 (two) times daily.    . pantoprazole (PROTONIX) 40 MG tablet Take 40 mg by mouth daily.    Marland Kitchen PIOGLITAZONE HCL PO Take 40 mg by mouth daily.    Marland Kitchen zolpidem (AMBIEN) 10 MG tablet Take 10 mg by mouth at bedtime as needed for sleep.    . metoprolol succinate (TOPROL-XL) 50 MG 24 hr  tablet Take 1 tablet (50 mg total) by mouth daily. 90 tablet 3  . rivaroxaban ([name]) 20 MG TABS tablet Take 1 tablet (20 mg total) by mouth daily with supper. 90 tablet 3   No current facility-administered medications for this visit.     Allergies:   Penicillins    ROS:  Please see the history of present illness.   Otherwise, review of systems are none.   All other systems are reviewed and negative.    PHYSICAL EXAM: VS:  BP 120/62   Pulse (!) 101   Ht 5\' 10"  (1.778 m)   Wt 232 lb (105.2 kg)   BMI 33.29 kg/m  , BMI Body mass index is 33.29 kg/m. GENERAL:  Well appearing NECK:  No jugular venous distention, waveform within normal limits, carotid upstroke brisk and symmetric, no bruits, no thyromegaly LUNGS:  Clear to auscultation bilaterally CHEST:  Unremarkable HEART:  PMI not displaced or sustained,S1 and S2 within normal limits, no S3, no clicks, no rubs, irregular  murmurs ABD:   Flat, positive bowel sounds normal in frequency in pitch, no bruits, no rebound, no guarding, no midline pulsatile mass, no hepatomegaly, no splenomegaly EXT:  2 plus pulses throughout, no edema, no cyanosis no clubbing   EKG:  EKG is  ordered today. The ekg ordered 09/17/18 atrial fibrillation, rate 101, axis within normal limits, intervals within normal limits, no acute ST-T wave changes.   Recent Labs: No results found for requested labs within last 8760 hours.    Lipid Panel No results found for: CHOL, TRIG, HDL, CHOLHDL, VLDL, LDLCALC, LDLDIRECT    Wt Readings from Last 3 Encounters:  10/16/18 232 lb (105.2 kg)  10/01/18 237 lb (107.5 kg)      Other studies Reviewed: Additional studies/ records that were reviewed today include: Echo, urology note, Holter Review of the above records demonstrates:  See elsewhere   ASSESSMENT AND PLAN:  ATRIAL FIB:   Mr. [name] has a CHA2DS2 - VASc score of 4.  We had another long discussion about this.  He understands the risks and he agrees to try anticoagulation again but does not take [name] I will give him [name].  We will give him a slightly higher dose of metoprolol as his rate is a little bit high today but he is overall fairly well controlled.  He could consider cardioversion although I do not think there is significant benefit as he does not feel his fibrillation but he will discuss this with his new cardiologist when he gets to [place].  DYSPNEA:   His BNP was slightly elevated.  This is probably related to the slightly reduced ejection fraction.  I think he will do well with salt and fluid restriction and weight loss.  Right now he is not seeing he has any shortness of breath.  No change in therapy.   HEMATURIA: As above.  He can follow-up with urology.  There was apparently no contraindication to restarting his blood thinner.: Questionable hematuria and this will be sent off.  He does report that his urine is now  clear.  ANEMIA:   His hemoglobin was 11.3 previously.  I will check a CBC after he is been on [name] for couple of weeks and before he moves to [place].  Current medicines are reviewed at length with the patient today.  The patient does not have concerns regarding medicines.  The following changes have been made:  As above  Labs/ tests ordered today include:  Orders Placed This Encounter  Procedures  . CBC  . EKG 12-Lead     Disposition:   FU with me in when he returns from [facility].  I will put a six month visit on the books.  Ronnell Guadalajara, MD  10/16/2018 4:29 PM    [place]

## 2018-10-16 ENCOUNTER — Encounter: Payer: Self-pay | Admitting: Cardiology

## 2018-10-16 ENCOUNTER — Ambulatory Visit (INDEPENDENT_AMBULATORY_CARE_PROVIDER_SITE_OTHER): Payer: Medicare Other | Admitting: Cardiology

## 2018-10-16 VITALS — BP 120/62 | HR 101 | Ht 70.0 in | Wt 232.0 lb

## 2018-10-16 DIAGNOSIS — I4891 Unspecified atrial fibrillation: Secondary | ICD-10-CM | POA: Insufficient documentation

## 2018-10-16 DIAGNOSIS — I4819 Other persistent atrial fibrillation: Secondary | ICD-10-CM | POA: Diagnosis not present

## 2018-10-16 DIAGNOSIS — Z79899 Other long term (current) drug therapy: Secondary | ICD-10-CM

## 2018-10-16 MED ORDER — METOPROLOL SUCCINATE ER 50 MG PO TB24: 50.0000 mg | ORAL_TABLET | Freq: Every day | ORAL | 3 refills | Status: AC

## 2018-10-16 MED ORDER — RIVAROXABAN 20 MG PO TABS: 20.0000 mg | ORAL_TABLET | Freq: Every day | ORAL | 3 refills | Status: DC

## 2018-10-16 NOTE — Patient Instructions (Addendum)
Medication Instructions:  Start: Xarelto 20 mg daily Increase: Metoprolol 50 mg daily If you need a refill on your cardiac medications before your next appointment, please call your pharmacy.   Lab work: Your physician recommends that you return for lab work before you leave for [place] (CBC)   Testing/Procedures: None  Follow-Up: At [facility], you and your health needs are our priority.  As part of our continuing mission to provide you with exceptional heart care, we have created designated Provider Care Teams.  These Care Teams include your primary Cardiologist (physician) and Advanced Practice Providers (APPs -  Physician Assistants and Nurse Practitioners) who all work together to provide you with the care you need, when you need it. You will need a follow up appointment in 6 months.  Please call our office 2 months in advance to schedule this appointment.  You may see Dr. Percival Spanish or one of the following Advanced Practice Providers on your designated Care Team:   Rosaria Ferries, PA-C . Jory Sims, DNP, ANP

## 2018-10-17 DIAGNOSIS — N183 Chronic kidney disease, stage 3 (moderate): Secondary | ICD-10-CM | POA: Diagnosis not present

## 2018-10-17 DIAGNOSIS — I4891 Unspecified atrial fibrillation: Secondary | ICD-10-CM | POA: Diagnosis not present

## 2018-10-17 DIAGNOSIS — K219 Gastro-esophageal reflux disease without esophagitis: Secondary | ICD-10-CM | POA: Diagnosis not present

## 2018-10-17 DIAGNOSIS — I10 Essential (primary) hypertension: Secondary | ICD-10-CM | POA: Diagnosis not present

## 2018-10-17 DIAGNOSIS — Z1389 Encounter for screening for other disorder: Secondary | ICD-10-CM | POA: Diagnosis not present

## 2018-10-17 DIAGNOSIS — E11319 Type 2 diabetes mellitus with unspecified diabetic retinopathy without macular edema: Secondary | ICD-10-CM | POA: Diagnosis not present

## 2018-10-17 DIAGNOSIS — Z0000 Encounter for general adult medical examination without abnormal findings: Secondary | ICD-10-CM | POA: Diagnosis not present

## 2018-10-17 DIAGNOSIS — E782 Mixed hyperlipidemia: Secondary | ICD-10-CM | POA: Diagnosis not present

## 2018-11-05 DIAGNOSIS — Z79899 Other long term (current) drug therapy: Secondary | ICD-10-CM | POA: Diagnosis not present

## 2018-11-06 LAB — CBC
Hematocrit: 32.6 % — ABNORMAL LOW (ref 37.5–51.0)
Hemoglobin: 10.4 g/dL — ABNORMAL LOW (ref 13.0–17.7)
MCH: 29.5 pg (ref 26.6–33.0)
MCHC: 31.9 g/dL (ref 31.5–35.7)
MCV: 92 fL (ref 79–97)
Platelets: 349 10*3/uL (ref 150–450)
RBC: 3.53 x10E6/uL — ABNORMAL LOW (ref 4.14–5.80)
RDW: 15.1 % (ref 11.6–15.4)
WBC: 4.6 10*3/uL (ref 3.4–10.8)

## 2019-01-20 ENCOUNTER — Telehealth: Payer: Self-pay | Admitting: Cardiology

## 2019-01-20 ENCOUNTER — Encounter: Payer: Self-pay | Admitting: Cardiology

## 2019-01-20 NOTE — Telephone Encounter (Signed)
This contact information is now noted in the chart.   [name] ph: [phone] date birth 01-Oct-1931  I will visit Dr Haywood Lasso medical director cardiology [place], [place] on 02/06/19  Phone# [phone]. I gave his nurses the complete package of information from you.  You asked for this information and said you could call him.  I am doing good with the last blood thinner you prescribed.

## 2019-03-12 DIAGNOSIS — H01001 Unspecified blepharitis right upper eyelid: Secondary | ICD-10-CM | POA: Diagnosis not present

## 2019-03-12 DIAGNOSIS — H01002 Unspecified blepharitis right lower eyelid: Secondary | ICD-10-CM | POA: Diagnosis not present

## 2019-03-12 DIAGNOSIS — H01004 Unspecified blepharitis left upper eyelid: Secondary | ICD-10-CM | POA: Diagnosis not present

## 2019-03-12 DIAGNOSIS — H01005 Unspecified blepharitis left lower eyelid: Secondary | ICD-10-CM | POA: Diagnosis not present

## 2019-03-31 DIAGNOSIS — Z85828 Personal history of other malignant neoplasm of skin: Secondary | ICD-10-CM | POA: Diagnosis not present

## 2019-03-31 DIAGNOSIS — L821 Other seborrheic keratosis: Secondary | ICD-10-CM | POA: Diagnosis not present

## 2019-03-31 DIAGNOSIS — D1801 Hemangioma of skin and subcutaneous tissue: Secondary | ICD-10-CM | POA: Diagnosis not present

## 2019-03-31 DIAGNOSIS — L570 Actinic keratosis: Secondary | ICD-10-CM | POA: Diagnosis not present

## 2019-03-31 DIAGNOSIS — D692 Other nonthrombocytopenic purpura: Secondary | ICD-10-CM | POA: Diagnosis not present

## 2019-03-31 DIAGNOSIS — L812 Freckles: Secondary | ICD-10-CM | POA: Diagnosis not present

## 2019-04-27 DIAGNOSIS — S299XXA Unspecified injury of thorax, initial encounter: Secondary | ICD-10-CM | POA: Diagnosis not present

## 2019-04-27 DIAGNOSIS — S29011A Strain of muscle and tendon of front wall of thorax, initial encounter: Secondary | ICD-10-CM | POA: Diagnosis not present

## 2019-04-27 DIAGNOSIS — S2242XA Multiple fractures of ribs, left side, initial encounter for closed fracture: Secondary | ICD-10-CM | POA: Diagnosis not present

## 2019-05-07 DIAGNOSIS — N183 Chronic kidney disease, stage 3 (moderate): Secondary | ICD-10-CM | POA: Diagnosis not present

## 2019-05-07 DIAGNOSIS — E782 Mixed hyperlipidemia: Secondary | ICD-10-CM | POA: Diagnosis not present

## 2019-05-07 DIAGNOSIS — E11319 Type 2 diabetes mellitus with unspecified diabetic retinopathy without macular edema: Secondary | ICD-10-CM | POA: Diagnosis not present

## 2019-05-07 DIAGNOSIS — I4891 Unspecified atrial fibrillation: Secondary | ICD-10-CM | POA: Diagnosis not present

## 2019-05-07 DIAGNOSIS — Z7984 Long term (current) use of oral hypoglycemic drugs: Secondary | ICD-10-CM | POA: Diagnosis not present

## 2019-05-07 DIAGNOSIS — K219 Gastro-esophageal reflux disease without esophagitis: Secondary | ICD-10-CM | POA: Diagnosis not present

## 2019-05-07 DIAGNOSIS — I10 Essential (primary) hypertension: Secondary | ICD-10-CM | POA: Diagnosis not present

## 2019-06-15 ENCOUNTER — Ambulatory Visit: Payer: Medicare Other | Admitting: Cardiology

## 2019-07-02 DIAGNOSIS — H04123 Dry eye syndrome of bilateral lacrimal glands: Secondary | ICD-10-CM | POA: Diagnosis not present

## 2019-07-02 DIAGNOSIS — H353132 Nonexudative age-related macular degeneration, bilateral, intermediate dry stage: Secondary | ICD-10-CM | POA: Diagnosis not present

## 2019-07-02 DIAGNOSIS — H5319 Other subjective visual disturbances: Secondary | ICD-10-CM | POA: Diagnosis not present

## 2019-07-02 DIAGNOSIS — H524 Presbyopia: Secondary | ICD-10-CM | POA: Diagnosis not present

## 2019-07-02 DIAGNOSIS — E119 Type 2 diabetes mellitus without complications: Secondary | ICD-10-CM | POA: Diagnosis not present

## 2019-08-06 DIAGNOSIS — Z23 Encounter for immunization: Secondary | ICD-10-CM | POA: Diagnosis not present

## 2019-08-28 DIAGNOSIS — I050 Rheumatic mitral stenosis: Secondary | ICD-10-CM | POA: Diagnosis not present

## 2019-08-28 DIAGNOSIS — I4510 Unspecified right bundle-branch block: Secondary | ICD-10-CM | POA: Diagnosis not present

## 2019-08-28 DIAGNOSIS — I4891 Unspecified atrial fibrillation: Secondary | ICD-10-CM | POA: Diagnosis not present

## 2019-08-28 DIAGNOSIS — I351 Nonrheumatic aortic (valve) insufficiency: Secondary | ICD-10-CM | POA: Diagnosis not present

## 2019-08-28 DIAGNOSIS — I272 Pulmonary hypertension, unspecified: Secondary | ICD-10-CM | POA: Diagnosis not present

## 2019-09-21 ENCOUNTER — Other Ambulatory Visit: Payer: Self-pay | Admitting: Geriatric Medicine

## 2019-09-21 ENCOUNTER — Ambulatory Visit
Admission: RE | Admit: 2019-09-21 | Discharge: 2019-09-21 | Disposition: A | Discharge status: Home / Self Care | Payer: Medicare Other | Source: Ambulatory Visit | Attending: Geriatric Medicine | Admitting: Geriatric Medicine

## 2019-09-21 DIAGNOSIS — R0789 Other chest pain: Secondary | ICD-10-CM

## 2019-09-21 DIAGNOSIS — S2231XA Fracture of one rib, right side, initial encounter for closed fracture: Secondary | ICD-10-CM | POA: Diagnosis not present

## 2019-09-30 DIAGNOSIS — L821 Other seborrheic keratosis: Secondary | ICD-10-CM | POA: Diagnosis not present

## 2019-09-30 DIAGNOSIS — L570 Actinic keratosis: Secondary | ICD-10-CM | POA: Diagnosis not present

## 2019-09-30 DIAGNOSIS — B354 Tinea corporis: Secondary | ICD-10-CM | POA: Diagnosis not present

## 2019-09-30 DIAGNOSIS — Z85828 Personal history of other malignant neoplasm of skin: Secondary | ICD-10-CM | POA: Diagnosis not present

## 2019-10-05 ENCOUNTER — Other Ambulatory Visit: Payer: Self-pay | Admitting: Cardiology

## 2019-11-10 ENCOUNTER — Ambulatory Visit: Payer: Medicare Other | Attending: Internal Medicine

## 2019-11-10 DIAGNOSIS — Z23 Encounter for immunization: Secondary | ICD-10-CM | POA: Insufficient documentation

## 2019-11-10 NOTE — Progress Notes (Signed)
   Covid-19 Vaccination Clinic  Name:  [name]    MRN: [id number] DOB: [birth year]  11/10/2019  Mr. Gebhard was observed post Covid-19 immunization for 30 minutes based on pre-vaccination screening without incidence. He was provided with Vaccine Information Sheet and instruction to access the V-Safe system.   Mr. Venner was instructed to call 911 with any severe reactions post vaccine: Marland Kitchen Difficulty breathing  . Swelling of your face and throat  . A fast heartbeat  . A bad rash all over your body  . Dizziness and weakness    Immunizations Administered    Name Date Dose VIS Date Route   Pfizer COVID-19 Vaccine 11/10/2019  9:28 AM 0.3 mL 10/09/2019 Intramuscular   Manufacturer: [employer]   Lot: [id number]   [place]: [id number]

## 2019-11-13 ENCOUNTER — Ambulatory Visit: Payer: Medicare Other

## 2019-11-30 ENCOUNTER — Ambulatory Visit: Payer: Medicare Other | Attending: Internal Medicine

## 2019-11-30 DIAGNOSIS — Z23 Encounter for immunization: Secondary | ICD-10-CM

## 2019-11-30 NOTE — Progress Notes (Signed)
   Covid-19 Vaccination Clinic  Name:  [name]    MRN: [id number] DOB: 1930-11-26  11/30/2019  Mr. Bogdan was observed post Covid-19 immunization for 15 minutes without incidence. He was provided with Vaccine Information Sheet and instruction to access the V-Safe system.   Mr. Wachsmuth was instructed to call 911 with any severe reactions post vaccine: Marland Kitchen Difficulty breathing  . Swelling of your face and throat  . A fast heartbeat  . A bad rash all over your body  . Dizziness and weakness    Immunizations Administered    Name Date Dose VIS Date Route   Pfizer COVID-19 Vaccine 11/30/2019  9:57 AM 0.3 mL 10/09/2019 Intramuscular   Manufacturer: [place]   Lot: [id number]   [place]: [id number]

## 2019-12-24 IMAGING — DX DG RIBS W/ CHEST 3+V*R*
4 series · 4 of 4 positions shown · non-contrast
Comparison: 04/27/2019

CLINICAL DATA: Fall 3 days ago with right-sided chest pain, initial
encounter

EXAM:
RIGHT RIBS AND CHEST - 3+ VIEW

[dg ribs unilateral w/chest right (1 of 4)]
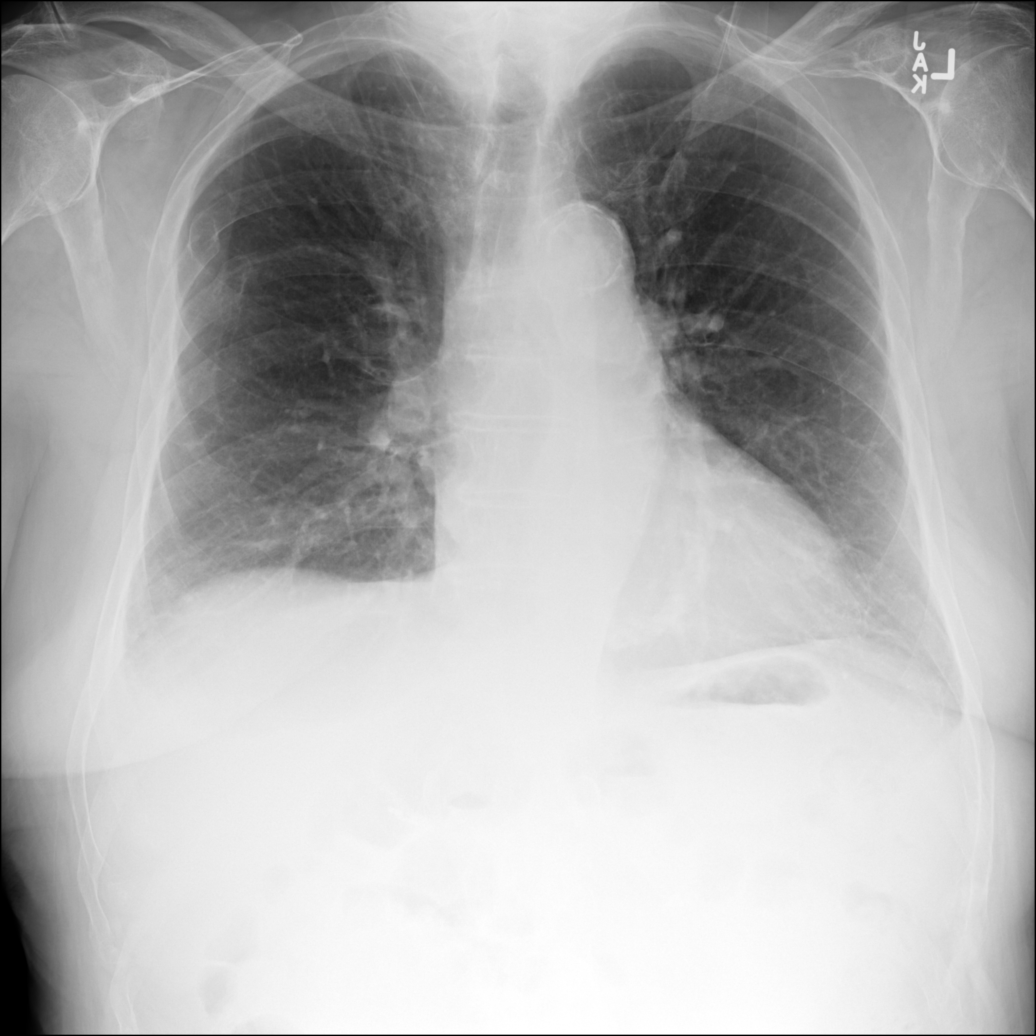

[dg ribs unilateral w/chest right (2 of 4)]
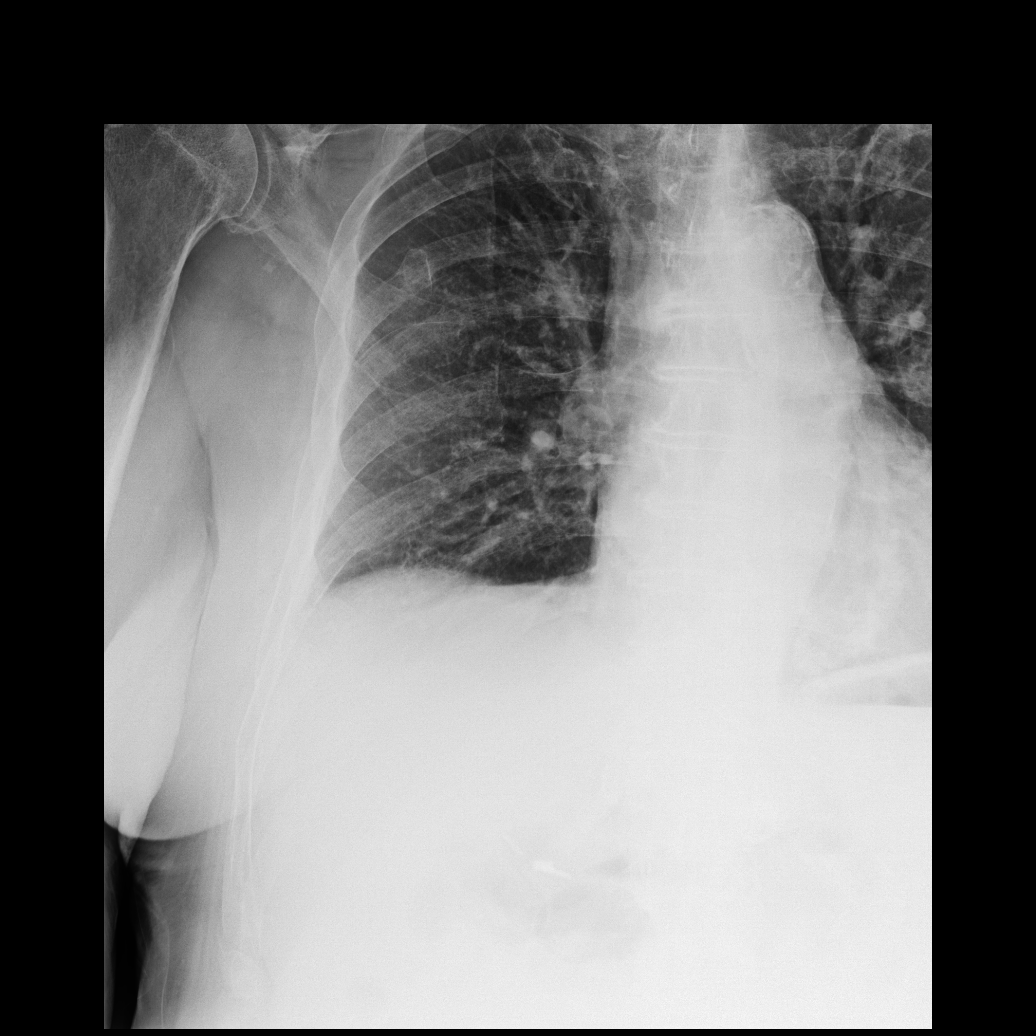

[dg ribs unilateral w/chest right (3 of 4)]
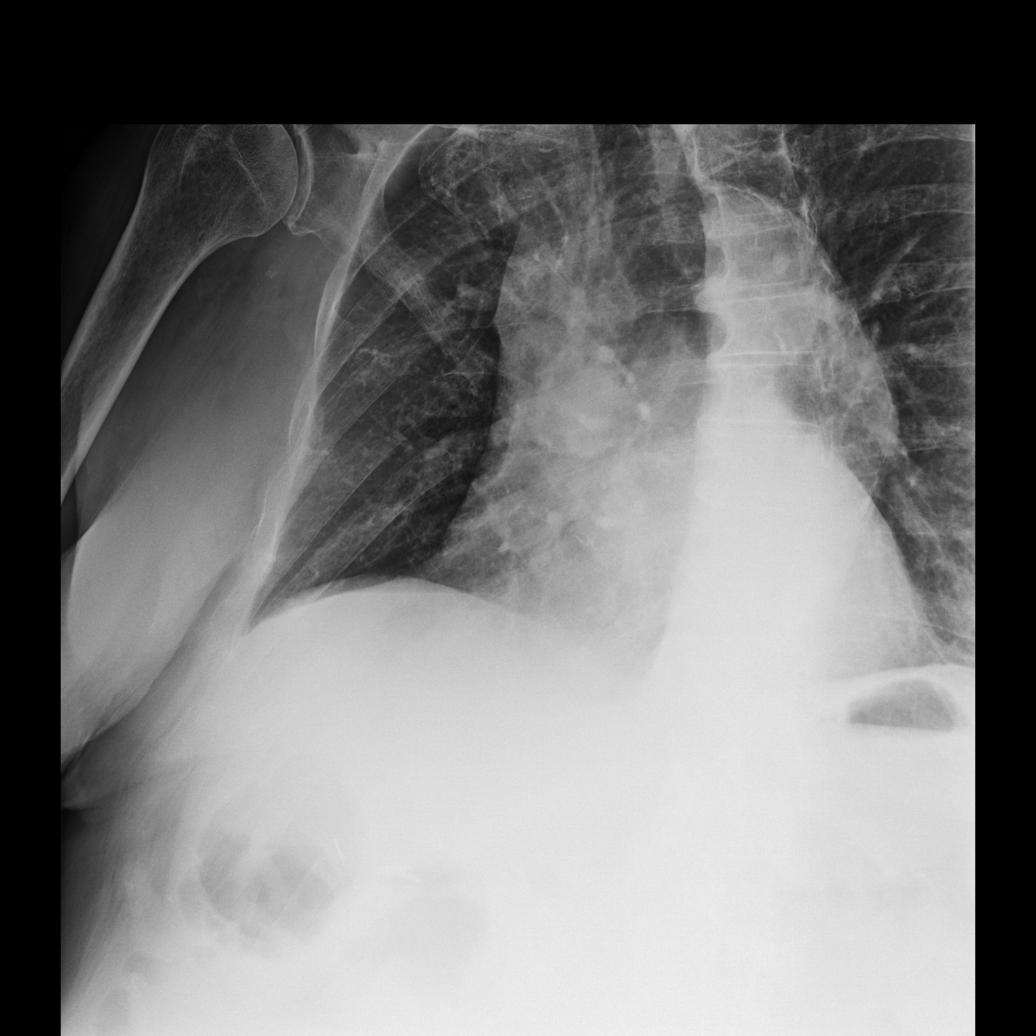

[dg ribs unilateral w/chest right (4 of 4)]
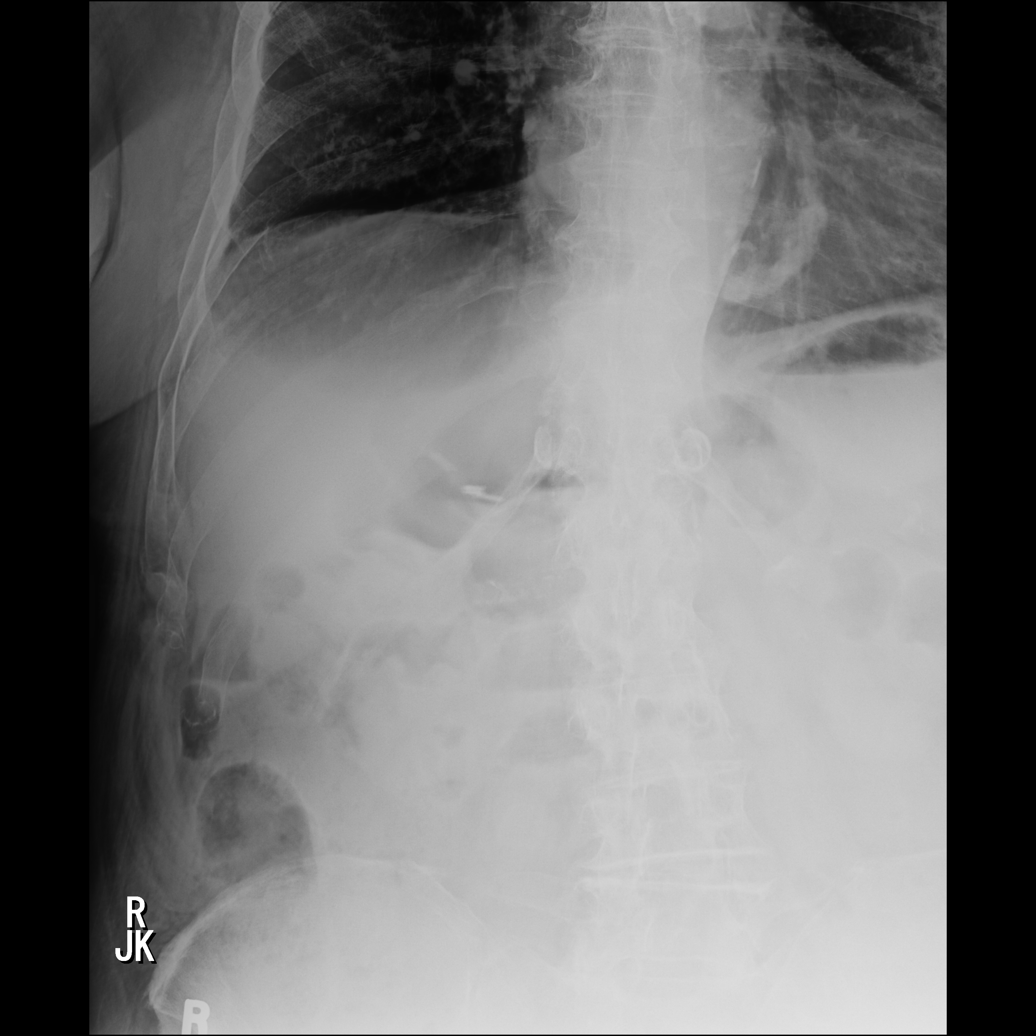

[4 of 4 positions shown; findings below may reference images not displayed]

FINDINGS: Cardiac shadows within normal limits. Aortic calcifications are
again seen and stable. The lungs are well aerated bilaterally
without focal infiltrate or sizable effusion. Old rib fractures are
noted on the right involving the posterior aspects of the third,
fifth and sixth ribs. Mildly displaced anterior ninth rib fracture
on the right is seen. Mild atelectatic changes and small right-sided
pleural effusion are noted.
IMPRESSION: Acute right ninth rib fracture anteriorly with associated
atelectasis and small effusion.

Multiple healed right rib fractures as described.

## 2020-01-29 DIAGNOSIS — L578 Other skin changes due to chronic exposure to nonionizing radiation: Secondary | ICD-10-CM | POA: Diagnosis not present

## 2020-01-29 DIAGNOSIS — L298 Other pruritus: Secondary | ICD-10-CM | POA: Diagnosis not present

## 2020-01-29 DIAGNOSIS — D485 Neoplasm of uncertain behavior of skin: Secondary | ICD-10-CM | POA: Diagnosis not present

## 2020-03-01 DIAGNOSIS — E11319 Type 2 diabetes mellitus with unspecified diabetic retinopathy without macular edema: Secondary | ICD-10-CM | POA: Diagnosis not present

## 2020-03-01 DIAGNOSIS — Z13220 Encounter for screening for lipoid disorders: Secondary | ICD-10-CM | POA: Diagnosis not present

## 2020-03-01 DIAGNOSIS — I10 Essential (primary) hypertension: Secondary | ICD-10-CM | POA: Diagnosis not present

## 2020-03-01 DIAGNOSIS — Z1389 Encounter for screening for other disorder: Secondary | ICD-10-CM | POA: Diagnosis not present

## 2020-03-01 DIAGNOSIS — H9313 Tinnitus, bilateral: Secondary | ICD-10-CM | POA: Diagnosis not present

## 2020-03-01 DIAGNOSIS — H9190 Unspecified hearing loss, unspecified ear: Secondary | ICD-10-CM | POA: Diagnosis not present

## 2020-03-01 DIAGNOSIS — D6869 Other thrombophilia: Secondary | ICD-10-CM | POA: Diagnosis not present

## 2020-03-01 DIAGNOSIS — E782 Mixed hyperlipidemia: Secondary | ICD-10-CM | POA: Diagnosis not present

## 2020-03-01 DIAGNOSIS — M1990 Unspecified osteoarthritis, unspecified site: Secondary | ICD-10-CM | POA: Diagnosis not present

## 2020-03-01 DIAGNOSIS — N183 Chronic kidney disease, stage 3 unspecified: Secondary | ICD-10-CM | POA: Diagnosis not present

## 2020-03-01 DIAGNOSIS — I4891 Unspecified atrial fibrillation: Secondary | ICD-10-CM | POA: Diagnosis not present

## 2020-03-01 DIAGNOSIS — Z0000 Encounter for general adult medical examination without abnormal findings: Secondary | ICD-10-CM | POA: Diagnosis not present

## 2020-03-03 ENCOUNTER — Telehealth: Payer: Self-pay | Admitting: Cardiology

## 2020-03-03 NOTE — Telephone Encounter (Signed)
  Patient would like to switch from Dr Percival Spanish to Dr Irish Lack since his wife is a patient of [place].

## 2020-03-03 NOTE — Telephone Encounter (Signed)
OK 

## 2020-03-07 DIAGNOSIS — C44329 Squamous cell carcinoma of skin of other parts of face: Secondary | ICD-10-CM | POA: Diagnosis not present

## 2020-03-07 DIAGNOSIS — L570 Actinic keratosis: Secondary | ICD-10-CM | POA: Diagnosis not present

## 2020-03-07 DIAGNOSIS — D0439 Carcinoma in situ of skin of other parts of face: Secondary | ICD-10-CM | POA: Diagnosis not present

## 2020-03-07 DIAGNOSIS — Z85828 Personal history of other malignant neoplasm of skin: Secondary | ICD-10-CM | POA: Diagnosis not present

## 2020-03-07 DIAGNOSIS — L821 Other seborrheic keratosis: Secondary | ICD-10-CM | POA: Diagnosis not present

## 2020-03-07 DIAGNOSIS — D485 Neoplasm of uncertain behavior of skin: Secondary | ICD-10-CM | POA: Diagnosis not present

## 2020-03-07 NOTE — Telephone Encounter (Signed)
OK 

## 2020-03-16 NOTE — Progress Notes (Signed)
Cardiology Office Note   Date:  03/17/2020   ID:  [name], DOB [birth year], MRN [id number]  PCP:  Wenda Low, MD    No chief complaint on file.  Atrial fibrillation  Wt Readings from Last 3 Encounters:  03/17/20 226 lb (102.5 kg)  10/16/18 232 lb (105.2 kg)  10/01/18 237 lb (107.5 kg)       History of Present Illness: [name] is a 86 y.o. male was seen in the past by Dr. Percival Spanish for atrial fibrillation.  He wanted to see me because I have also his wife's doctor.  He was treated with metoprolol and Eliquis.  In 2019, he did develop some urinary bleeding.  His Eliquis was stopped at that time and he decided he did not want to take Eliquis anymore.  Xarelto was started in 2019.  Metoprolol was increased for better rate control.  had an echocardiogram which demonstrated some apical akinesis and slightly low ejection fraction of 50 to 55%.  No further evaluation was done since he was essentially asymptomatic.  He was treated with HCTZ for elevated BNP.  There was some plan for him to move to [place].  He spends 6 months of the year there.  With 20 mg Xarelto, he had bleeding so it was cut to 15 mg.  His PMD cut it back to 7.5 mg per his report and bleeding is better.    He is willing to go to 10 mg daily, but not willing to go higher than that due to bleeding.   he has skin surgery on his face for skin surgery.   Denies : Chest pain. Dizziness. Leg edema. Nitroglycerin use. Orthopnea. Palpitations. Paroxysmal nocturnal dyspnea. Shortness of breath. Syncope.   Rarely checks BP at home but it is well controlled per his report.    Past Medical History:  Diagnosis Date  . A-fib ([place])   . Chondrocalcinosis of knee   . DM (diabetes mellitus) ([place])   . ED (erectile dysfunction)   . Elevated LFTs   . Fall 2013  . GERD (gastroesophageal reflux disease)   . H. pylori infection 1992  . Hearing aid worn   . Hypertension 1994  . Insomnia   . Irregular heart beat    . Lumbar radiculopathy 2010  . Melanoma ([place])   . OA (osteoarthritis) of knee   . Prostatism   . Rib fracture 2013  . SOB (shortness of breath)   . Tinnitus   . Trochanteric bursitis 2010  . Urinary incontinence     Past Surgical History:  Procedure Laterality Date  . CATARACT EXTRACTION    . CHOLECYSTECTOMY    . TONSILLECTOMY       Current Outpatient Medications  Medication Sig Dispense Refill  . Acetaminophen (TYLENOL ARTHRITIS PAIN PO) Take by mouth. TAKE ONE TABLETS EVERY NIGHT    . atorvastatin (LIPITOR) 40 MG tablet Take 40 mg by mouth daily.    . Cyanocobalamin (VITAMIN B 12 PO) Take 1,000 mg by mouth.    . ENALAPRIL MALEATE PO Take 20 mg by mouth.    . fenofibrate (TRICOR) 145 MG tablet Take 145 mg by mouth daily.    Marland Kitchen glipiZIDE (GLUCOTROL) 5 MG tablet Take 2.5 mg by mouth daily before breakfast.    . hydrochlorothiazide (HYDRODIURIL) 25 MG tablet Take 25 mg by mouth daily.    . metFORMIN (GLUCOPHAGE) 500 MG tablet Take 500 mg by mouth 2 (two) times daily with a meal.    .  metoprolol succinate (TOPROL-XL) 50 MG 24 hr tablet Take 1 tablet (50 mg total) by mouth daily. 90 tablet 3  . MULTIPLE VITAMIN PO Take by mouth daily.    . Multiple Vitamins-Minerals (RA VISION-VITE PRESERVE PO) Take by mouth 2 (two) times daily.    . pantoprazole (PROTONIX) 40 MG tablet Take 40 mg by mouth daily.    Marland Kitchen PIOGLITAZONE HCL PO Take 40 mg by mouth daily.    . Rivaroxaban (XARELTO) 15 MG TABS tablet Take 7.5 mg by mouth daily with supper.    . zolpidem (AMBIEN) 10 MG tablet Take 10 mg by mouth at bedtime as needed for sleep.     No current facility-administered medications for this visit.    Allergies:   Penicillins    Social History:  The patient  reports that he has been smoking cigars. He has never used smokeless tobacco. He reports current alcohol use. He reports that he does not use drugs.   Family History:  The patient's family history includes Diabetes in his father;  Pancreatic cancer in his sister; Prostate cancer in his brother.    ROS:  Please see the history of present illness.   Otherwise, review of systems are positive for easy bleeding / bruising.   All other systems are reviewed and negative.    PHYSICAL EXAM: VS:  BP (!) 148/72   Pulse 79   Ht 5\' 10"  (1.778 m)   Wt 226 lb (102.5 kg)   SpO2 93%   BMI 32.43 kg/m  , BMI Body mass index is 32.43 kg/m. GEN: Well nourished, well developed, in no acute distress  HEENT: normal  Neck: no JVD, carotid bruits, or masses Cardiac: irregularly irregular; no murmurs, rubs, or gallops,; left ankle swelling - chronic ( has had left knee problems) Respiratory:  clear to auscultation bilaterally, normal work of breathing GI: soft, nontender, nondistended, + BS MS: no deformity or atrophy  Skin: warm and dry, no rash Neuro:  Strength and sensation are intact Psych: euthymic mood, full affect   EKG:   The ekg ordered today demonstrates AFib, rate controlled   Recent Labs: No results found for requested labs within last 8760 hours.   Lipid Panel No results found for: CHOL, TRIG, HDL, CHOLHDL, VLDL, LDLCALC, LDLDIRECT   Other studies Reviewed: Additional studies/ records that were reviewed today with results demonstrating: labs reviewed LDL 70 in 2019. 5/21 TC 121.   ASSESSMENT AND PLAN:  1. Atrial fibrillation: Anticoagulation recommended due to CHA2DS2-VASc score of 4.  Metoprolol for rate control.  No sx of palpitations. Would try Xarelto 10 mg tab, per his request.  He will not go higher than 10 mg based on prior bleeding issues despite me informing him that current data would recommend 20 mg daily for most effective stroke prevention,  based on his weight nad renal function.  2. Diabetes: Whole food, plant-based diet.  Increase fiber intake. 3.  LE edema: left ankle more swollen.  He has chronic left knee pain and arthritis.   4. He has received both COVID vaccines.   Current medicines are  reviewed at length with the patient today.  The patient concerns regarding his medicines were addressed.  The following changes have been made:  No change  Labs/ tests ordered today include:  No orders of the defined types were placed in this encounter.   Recommend 150 minutes/week of aerobic exercise Low fat, low carb, high fiber diet recommended  Disposition:   FU in 1  year   Signed, Larae Grooms, MD  03/17/2020 10:21 AM    [address], [place], [place]  [address] Phone: [phone]; Fax: [phone]

## 2020-03-17 ENCOUNTER — Other Ambulatory Visit: Payer: Self-pay

## 2020-03-17 ENCOUNTER — Ambulatory Visit (INDEPENDENT_AMBULATORY_CARE_PROVIDER_SITE_OTHER): Payer: Medicare Other | Admitting: Interventional Cardiology

## 2020-03-17 ENCOUNTER — Encounter: Payer: Self-pay | Admitting: Interventional Cardiology

## 2020-03-17 VITALS — BP 148/72 | HR 79 | Ht 70.0 in | Wt 226.0 lb

## 2020-03-17 DIAGNOSIS — E1159 Type 2 diabetes mellitus with other circulatory complications: Secondary | ICD-10-CM | POA: Diagnosis not present

## 2020-03-17 DIAGNOSIS — R600 Localized edema: Secondary | ICD-10-CM | POA: Diagnosis not present

## 2020-03-17 DIAGNOSIS — I4891 Unspecified atrial fibrillation: Secondary | ICD-10-CM

## 2020-03-17 NOTE — Patient Instructions (Signed)
Medication Instructions:  Your physician recommends that you continue on your current medications as directed. Please refer to the Current Medication list given to you today.  *If you need a refill on your cardiac medications before your next appointment, please call your pharmacy*   Lab Work: None ordered  If you have labs (blood work) drawn today and your tests are completely normal, you will receive your results only by: . MyChart Message (if you have MyChart) OR . A paper copy in the mail If you have any lab test that is abnormal or we need to change your treatment, we will call you to review the results.   Testing/Procedures: None ordered   Follow-Up: At [facility], you and your health needs are our priority.  As part of our continuing mission to provide you with exceptional heart care, we have created designated Provider Care Teams.  These Care Teams include your primary Cardiologist (physician) and Advanced Practice Providers (APPs -  Physician Assistants and Nurse Practitioners) who all work together to provide you with the care you need, when you need it.  We recommend signing up for the patient portal called "MyChart".  Sign up information is provided on this After Visit Summary.  MyChart is used to connect with patients for Virtual Visits (Telemedicine).  Patients are able to view lab/test results, encounter notes, upcoming appointments, etc.  Non-urgent messages can be sent to your provider as well.   To learn more about what you can do with MyChart, go to [email or web address].    Your next appointment:   12 month(s)  The format for your next appointment:   In Person  Provider:   You may see Jayadeep Varanasi, MD or one of the following Advanced Practice Providers on your designated Care Team:    Dayna Dunn, PA-C  Michele Lenze, PA-C    Other Instructions  High-Fiber Diet Fiber, also called dietary fiber, is a type of carbohydrate that is found in fruits,  vegetables, whole grains, and beans. A high-fiber diet can have many health benefits. Your health care provider may recommend a high-fiber diet to help:  Prevent constipation. Fiber can make your bowel movements more regular.  Lower your cholesterol.  Relieve the following conditions: ? Swelling of veins in the anus (hemorrhoids). ? Swelling and irritation (inflammation) of specific areas of the digestive tract (uncomplicated diverticulosis). ? A problem of the large intestine (colon) that sometimes causes pain and diarrhea (irritable bowel syndrome, IBS).  Prevent overeating as part of a weight-loss plan.  Prevent heart disease, type 2 diabetes, and certain cancers. What is my plan? The recommended daily fiber intake in grams (g) includes:  38 g for men age 50 or younger.  30 g for men over age 50.  25 g for women age 50 or younger.  21 g for women over age 50. You can get the recommended daily intake of dietary fiber by:  Eating a variety of fruits, vegetables, grains, and beans.  Taking a fiber supplement, if it is not possible to get enough fiber through your diet. What do I need to know about a high-fiber diet?  It is better to get fiber through food sources rather than from fiber supplements. There is not a lot of research about how effective supplements are.  Always check the fiber content on the nutrition facts label of any prepackaged food. Look for foods that contain 5 g of fiber or more per serving.  Talk with a diet and nutrition specialist (  dietitian) if you have questions about specific foods that are recommended or not recommended for your medical condition, especially if those foods are not listed below.  Gradually increase how much fiber you consume. If you increase your intake of dietary fiber too quickly, you may have bloating, cramping, or gas.  Drink plenty of water. Water helps you to digest fiber. What are tips for following this plan?  Eat a wide  variety of high-fiber foods.  Make sure that half of the grains that you eat each day are whole grains.  Eat breads and cereals that are made with whole-grain flour instead of refined flour or white flour.  Eat brown rice, bulgur wheat, or millet instead of white rice.  Start the day with a breakfast that is high in fiber, such as a cereal that contains 5 g of fiber or more per serving.  Use beans in place of meat in soups, salads, and pasta dishes.  Eat high-fiber snacks, such as berries, raw vegetables, nuts, and popcorn.  Choose whole fruits and vegetables instead of processed forms like juice or sauce. What foods can I eat?  Fruits Berries. Pears. Apples. Oranges. Avocado. Prunes and raisins. Dried figs. Vegetables Sweet potatoes. Spinach. Kale. Artichokes. Cabbage. Broccoli. Cauliflower. Green peas. Carrots. Squash. Grains Whole-grain breads. Multigrain cereal. Oats and oatmeal. Brown rice. Barley. Bulgur wheat. Millet. Quinoa. Bran muffins. Popcorn. Rye wafer crackers. Meats and other proteins Navy, kidney, and pinto beans. Soybeans. Split peas. Lentils. Nuts and seeds. Dairy Fiber-fortified yogurt. Beverages Fiber-fortified soy milk. Fiber-fortified orange juice. Other foods Fiber bars. The items listed above may not be a complete list of recommended foods and beverages. Contact a dietitian for more options. What foods are not recommended? Fruits Fruit juice. Cooked, strained fruit. Vegetables Fried potatoes. Canned vegetables. Well-cooked vegetables. Grains White bread. Pasta made with refined flour. White rice. Meats and other proteins Fatty cuts of meat. Fried chicken or fried fish. Dairy Milk. Yogurt. Cream cheese. Sour cream. Fats and oils Butters. Beverages Soft drinks. Other foods Cakes and pastries. The items listed above may not be a complete list of foods and beverages to avoid. Contact a dietitian for more information. Summary  Fiber is a type of  carbohydrate. It is found in fruits, vegetables, whole grains, and beans.  There are many health benefits of eating a high-fiber diet, such as preventing constipation, lowering blood cholesterol, helping with weight loss, and reducing your risk of heart disease, diabetes, and certain cancers.  Gradually increase your intake of fiber. Increasing too fast can result in cramping, bloating, and gas. Drink plenty of water while you increase your fiber.  The best sources of fiber include whole fruits and vegetables, whole grains, nuts, seeds, and beans. This information is not intended to replace advice given to you by your health care provider. Make sure you discuss any questions you have with your health care provider. Document Revised: 08/19/2017 Document Reviewed: 08/19/2017 Elsevier Patient Education  2020 Elsevier Inc.   

## 2020-03-21 DIAGNOSIS — I4891 Unspecified atrial fibrillation: Secondary | ICD-10-CM | POA: Diagnosis not present

## 2020-03-21 DIAGNOSIS — I10 Essential (primary) hypertension: Secondary | ICD-10-CM | POA: Diagnosis not present

## 2020-03-21 DIAGNOSIS — G4700 Insomnia, unspecified: Secondary | ICD-10-CM | POA: Diagnosis not present

## 2020-03-21 DIAGNOSIS — N183 Chronic kidney disease, stage 3 unspecified: Secondary | ICD-10-CM | POA: Diagnosis not present

## 2020-03-21 DIAGNOSIS — E782 Mixed hyperlipidemia: Secondary | ICD-10-CM | POA: Diagnosis not present

## 2020-03-21 DIAGNOSIS — M1990 Unspecified osteoarthritis, unspecified site: Secondary | ICD-10-CM | POA: Diagnosis not present

## 2020-03-21 DIAGNOSIS — E11319 Type 2 diabetes mellitus with unspecified diabetic retinopathy without macular edema: Secondary | ICD-10-CM | POA: Diagnosis not present

## 2020-03-25 ENCOUNTER — Ambulatory Visit: Payer: Medicare Other | Admitting: Interventional Cardiology

## 2020-03-30 DIAGNOSIS — C44329 Squamous cell carcinoma of skin of other parts of face: Secondary | ICD-10-CM | POA: Diagnosis not present

## 2020-03-30 DIAGNOSIS — Z85828 Personal history of other malignant neoplasm of skin: Secondary | ICD-10-CM | POA: Diagnosis not present

## 2020-04-25 ENCOUNTER — Other Ambulatory Visit: Payer: Self-pay | Admitting: Interventional Cardiology

## 2020-04-25 DIAGNOSIS — I10 Essential (primary) hypertension: Secondary | ICD-10-CM | POA: Diagnosis not present

## 2020-04-25 DIAGNOSIS — G4700 Insomnia, unspecified: Secondary | ICD-10-CM | POA: Diagnosis not present

## 2020-04-25 DIAGNOSIS — I4891 Unspecified atrial fibrillation: Secondary | ICD-10-CM | POA: Diagnosis not present

## 2020-04-25 DIAGNOSIS — M1990 Unspecified osteoarthritis, unspecified site: Secondary | ICD-10-CM | POA: Diagnosis not present

## 2020-04-25 DIAGNOSIS — N183 Chronic kidney disease, stage 3 unspecified: Secondary | ICD-10-CM | POA: Diagnosis not present

## 2020-04-25 DIAGNOSIS — E782 Mixed hyperlipidemia: Secondary | ICD-10-CM | POA: Diagnosis not present

## 2020-04-25 MED ORDER — RIVAROXABAN 10 MG PO TABS: 10.0000 mg | ORAL_TABLET | Freq: Every day | ORAL | 1 refills | Status: AC

## 2020-04-25 NOTE — Telephone Encounter (Signed)
*  STAT* If patient is at the pharmacy, call can be transferred to refill team.   1. Which medications need to be refilled? (please list name of each medication and dose if known)? Rivaroxaban (XARELTO) 15 MG TABS tablet  2. Which pharmacy/location (including street and city if local pharmacy) is medication to be sent to? [facility] - [place], [place] - [address]  3. Do they need a 30 day or 90 day supply? 90 days   Patient only needs 10mg , also in Dr. Hassell Done OV note.

## 2020-04-25 NOTE — Telephone Encounter (Signed)
Pt last saw Dr Irish Lack 03/17/20, in his note discussed Xarelto dosing with pt. Age 86, weight 102.5kg, last labs 03/01/20 Creat 1.07 at [facility] per [facility], CrCl 71.85, based on CrCl pt should be taking 20mg  QD of Xarelto. Pt is agreeable to increasing Xarelto dosage from 7.5mg  QD which he is currently taking as rx by PCP, to 10mg  daily.  Pt is aware recommended dosage is 20mg  QD, and is aware of risks associated with decreased dosage per Dr Hassell Done note.  Pt has had bleeding in the past and is not willing to take more than 10mg  of Xarelto daily. Per Dr Hassell Done not will send in rx for Xarelto 10mg  QD.

## 2020-05-04 DIAGNOSIS — I10 Essential (primary) hypertension: Secondary | ICD-10-CM | POA: Diagnosis not present

## 2020-05-04 DIAGNOSIS — R079 Chest pain, unspecified: Secondary | ICD-10-CM | POA: Diagnosis not present

## 2020-05-04 DIAGNOSIS — Z79899 Other long term (current) drug therapy: Secondary | ICD-10-CM | POA: Diagnosis not present

## 2020-05-04 DIAGNOSIS — I2510 Atherosclerotic heart disease of native coronary artery without angina pectoris: Secondary | ICD-10-CM | POA: Diagnosis not present

## 2020-05-04 DIAGNOSIS — R0781 Pleurodynia: Secondary | ICD-10-CM | POA: Diagnosis not present

## 2020-05-04 DIAGNOSIS — I959 Hypotension, unspecified: Secondary | ICD-10-CM | POA: Diagnosis not present

## 2020-05-04 DIAGNOSIS — J90 Pleural effusion, not elsewhere classified: Secondary | ICD-10-CM | POA: Diagnosis not present

## 2020-05-04 DIAGNOSIS — E7800 Pure hypercholesterolemia, unspecified: Secondary | ICD-10-CM | POA: Diagnosis not present

## 2020-05-04 DIAGNOSIS — R0789 Other chest pain: Secondary | ICD-10-CM | POA: Diagnosis not present

## 2020-05-04 DIAGNOSIS — Z7984 Long term (current) use of oral hypoglycemic drugs: Secondary | ICD-10-CM | POA: Diagnosis not present

## 2020-05-04 DIAGNOSIS — S8010XA Contusion of unspecified lower leg, initial encounter: Secondary | ICD-10-CM | POA: Diagnosis not present

## 2020-05-04 DIAGNOSIS — I4891 Unspecified atrial fibrillation: Secondary | ICD-10-CM | POA: Diagnosis not present

## 2020-05-04 DIAGNOSIS — K219 Gastro-esophageal reflux disease without esophagitis: Secondary | ICD-10-CM | POA: Diagnosis not present

## 2020-05-04 DIAGNOSIS — R52 Pain, unspecified: Secondary | ICD-10-CM | POA: Diagnosis not present

## 2020-05-04 DIAGNOSIS — S20219A Contusion of unspecified front wall of thorax, initial encounter: Secondary | ICD-10-CM | POA: Diagnosis not present

## 2020-05-04 DIAGNOSIS — Z880 Allergy status to penicillin: Secondary | ICD-10-CM | POA: Diagnosis not present

## 2020-05-10 ENCOUNTER — Other Ambulatory Visit: Payer: Self-pay | Admitting: Internal Medicine

## 2020-05-10 ENCOUNTER — Ambulatory Visit
Admission: RE | Admit: 2020-05-10 | Discharge: 2020-05-10 | Disposition: A | Discharge status: Home / Self Care | Payer: Medicare Other | Source: Ambulatory Visit | Attending: Internal Medicine | Admitting: Internal Medicine

## 2020-05-10 DIAGNOSIS — M7989 Other specified soft tissue disorders: Secondary | ICD-10-CM | POA: Diagnosis not present

## 2020-05-10 DIAGNOSIS — S2220XA Unspecified fracture of sternum, initial encounter for closed fracture: Secondary | ICD-10-CM | POA: Diagnosis not present

## 2020-05-10 DIAGNOSIS — S8012XA Contusion of left lower leg, initial encounter: Secondary | ICD-10-CM | POA: Diagnosis not present

## 2020-05-10 DIAGNOSIS — R0789 Other chest pain: Secondary | ICD-10-CM | POA: Diagnosis not present

## 2020-05-17 DIAGNOSIS — L0390 Cellulitis, unspecified: Secondary | ICD-10-CM | POA: Diagnosis not present

## 2020-05-17 DIAGNOSIS — R600 Localized edema: Secondary | ICD-10-CM | POA: Diagnosis not present

## 2020-05-17 DIAGNOSIS — S2220XA Unspecified fracture of sternum, initial encounter for closed fracture: Secondary | ICD-10-CM | POA: Diagnosis not present

## 2020-05-26 DIAGNOSIS — E11319 Type 2 diabetes mellitus with unspecified diabetic retinopathy without macular edema: Secondary | ICD-10-CM | POA: Diagnosis not present

## 2020-05-26 DIAGNOSIS — G4700 Insomnia, unspecified: Secondary | ICD-10-CM | POA: Diagnosis not present

## 2020-05-26 DIAGNOSIS — N183 Chronic kidney disease, stage 3 unspecified: Secondary | ICD-10-CM | POA: Diagnosis not present

## 2020-05-26 DIAGNOSIS — E782 Mixed hyperlipidemia: Secondary | ICD-10-CM | POA: Diagnosis not present

## 2020-05-26 DIAGNOSIS — I4891 Unspecified atrial fibrillation: Secondary | ICD-10-CM | POA: Diagnosis not present

## 2020-05-26 DIAGNOSIS — I10 Essential (primary) hypertension: Secondary | ICD-10-CM | POA: Diagnosis not present

## 2020-05-26 DIAGNOSIS — M1990 Unspecified osteoarthritis, unspecified site: Secondary | ICD-10-CM | POA: Diagnosis not present

## 2020-05-30 DIAGNOSIS — C44329 Squamous cell carcinoma of skin of other parts of face: Secondary | ICD-10-CM | POA: Diagnosis not present

## 2020-05-30 DIAGNOSIS — L570 Actinic keratosis: Secondary | ICD-10-CM | POA: Diagnosis not present

## 2020-05-30 DIAGNOSIS — D485 Neoplasm of uncertain behavior of skin: Secondary | ICD-10-CM | POA: Diagnosis not present

## 2020-05-30 DIAGNOSIS — C44309 Unspecified malignant neoplasm of skin of other parts of face: Secondary | ICD-10-CM | POA: Diagnosis not present

## 2020-05-30 DIAGNOSIS — Z85828 Personal history of other malignant neoplasm of skin: Secondary | ICD-10-CM | POA: Diagnosis not present

## 2020-06-01 DIAGNOSIS — M79606 Pain in leg, unspecified: Secondary | ICD-10-CM | POA: Diagnosis not present

## 2020-06-01 DIAGNOSIS — R609 Edema, unspecified: Secondary | ICD-10-CM | POA: Diagnosis not present

## 2020-06-01 DIAGNOSIS — G4700 Insomnia, unspecified: Secondary | ICD-10-CM | POA: Diagnosis not present

## 2020-06-01 DIAGNOSIS — S2220XA Unspecified fracture of sternum, initial encounter for closed fracture: Secondary | ICD-10-CM | POA: Diagnosis not present
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
## 2020-06-14 NOTE — Progress Notes (Signed)
Oncology Nurse Navigator Documentation  Placed introductory call to new referral patient [name]  Introduced myself as the H&N oncology nurse navigator that works with Dr. Isidore Moos to whom he has been referred by Dr. Jarome Matin. He confirmed understanding of referral.  Briefly explained my role as his navigator, provided my contact information.   Confirmed understanding of upcoming appts and [place] location, explained arrival and registration process.  I encouraged him to call with questions/concerns as he moves forward with appts and procedures.    He verbalized understanding of information provided, expressed appreciation for my call.   Navigator Initial Assessment . Employment Status: He is retired . Currently on FMLA / STD: na . Living Situation: he lives with his wife . Support System: wife . PCP: . PCD: . Financial Concerns:no . Transportation Needs: no . Sensory Deficits:hearing . Language Barriers/Interpreter Needed:  no . Ambulation Needs: no . DME Used in Home: no . Psychosocial Needs:  no . Concerns/Needs Understanding Cancer:  addressed/answered by navigator to best of ability . Self-Expressed Needs: no  Harlow Asa RN, BSN, OCN Head & Neck Oncology Nurse [place] at [facility] Phone # [phone]  Fax # [phone]

## 2020-06-15 ENCOUNTER — Encounter: Payer: Self-pay | Admitting: Radiation Oncology

## 2020-06-15 ENCOUNTER — Encounter: Payer: Self-pay | Admitting: *Deleted

## 2020-06-15 ENCOUNTER — Other Ambulatory Visit: Payer: Self-pay

## 2020-06-15 ENCOUNTER — Ambulatory Visit
Admission: RE | Admit: 2020-06-15 | Discharge: 2020-06-15 | Disposition: A | Discharge status: Home / Self Care | Payer: Medicare Other | Source: Ambulatory Visit | Attending: Radiation Oncology | Admitting: Radiation Oncology

## 2020-06-15 VITALS — BP 134/46 | HR 81 | Temp 98.2°F | Resp 18 | Ht 70.0 in | Wt 226.2 lb

## 2020-06-15 DIAGNOSIS — K219 Gastro-esophageal reflux disease without esophagitis: Secondary | ICD-10-CM | POA: Insufficient documentation

## 2020-06-15 DIAGNOSIS — E119 Type 2 diabetes mellitus without complications: Secondary | ICD-10-CM | POA: Insufficient documentation

## 2020-06-15 DIAGNOSIS — Z800 Family history of malignant neoplasm of digestive organs: Secondary | ICD-10-CM | POA: Insufficient documentation

## 2020-06-15 DIAGNOSIS — I4891 Unspecified atrial fibrillation: Secondary | ICD-10-CM | POA: Insufficient documentation

## 2020-06-15 DIAGNOSIS — C44320 Squamous cell carcinoma of skin of unspecified parts of face: Secondary | ICD-10-CM

## 2020-06-15 DIAGNOSIS — Z87891 Personal history of nicotine dependence: Secondary | ICD-10-CM | POA: Diagnosis not present

## 2020-06-15 DIAGNOSIS — C44329 Squamous cell carcinoma of skin of other parts of face: Secondary | ICD-10-CM

## 2020-06-15 DIAGNOSIS — M1990 Unspecified osteoarthritis, unspecified site: Secondary | ICD-10-CM | POA: Diagnosis not present

## 2020-06-15 DIAGNOSIS — Z8042 Family history of malignant neoplasm of prostate: Secondary | ICD-10-CM | POA: Diagnosis not present

## 2020-06-15 DIAGNOSIS — I10 Essential (primary) hypertension: Secondary | ICD-10-CM | POA: Insufficient documentation

## 2020-06-15 DIAGNOSIS — Z9889 Other specified postprocedural states: Secondary | ICD-10-CM | POA: Diagnosis not present

## 2020-06-15 DIAGNOSIS — N529 Male erectile dysfunction, unspecified: Secondary | ICD-10-CM | POA: Insufficient documentation

## 2020-06-15 DIAGNOSIS — G4700 Insomnia, unspecified: Secondary | ICD-10-CM | POA: Diagnosis not present

## 2020-06-15 DIAGNOSIS — Z85820 Personal history of malignant melanoma of skin: Secondary | ICD-10-CM | POA: Diagnosis not present

## 2020-06-15 NOTE — Progress Notes (Signed)
Oncology Nurse Navigator Documentation  Met with patient before his initial consult with Dr. Isidore Moos today He was accompanied by his daughter. . Further introduced myself as his Navigator, explained my role as a member of the Care Team.   . Provided introductory explanation of radiation treatment including SIM planning and purpose of Aquaplast head and shoulder mask. . I encouraged them to contact me with questions/concerns as treatments/procedures begin.  They verbalized understanding of information provided.    Harlow Asa, RN, BSN, OCN Head & Neck Oncology Nurse [place] at [place] [phone]

## 2020-06-15 NOTE — Progress Notes (Signed)
[place] Psychosocial Distress Screening Clinical Social Work  Clinical Social Work was referred by distress screening protocol.  The patient scored a 8 on the Psychosocial Distress Thermometer which indicates moderate distress. Clinical Social Worker contacted patient by phone to assess for distress and other psychosocial needs.  Patient stated he was doing well, and most of his distress was contributed to his wife being in the hospital.  Patient stated she was still in the hospital, but doing "better".  CSW provided education on CSW role, and information on the support team and support services at [facility].  CSW and patient discussed the importance of support, and patient identified his wife, 4 children and 6 grandchildren as positive support.  Patient was appreciative of CSW contact.  CSW encouraged patient to call with needs or concerns.      ONCBCN DISTRESS SCREENING 06/15/2020  Screening Type Initial Screening  Distress experienced in past week (1-10) 8  Family Problem type Partner  Information Concerns Type Lack of info about diagnosis;Lack of info about treatment  Physician notified of physical symptoms Yes  Referral to clinical psychology No  Referral to clinical social work Yes  Referral to dietition No  Referral to financial advocate Yes  Referral to support programs Yes  Referral to palliative care No    Johnnye Lana, MSW, LCSW, OSW-C Clinical Social Worker [place] [phone]

## 2020-06-15 NOTE — Progress Notes (Signed)
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 Squamous cell carcinoma of preauricular region  C44.329 Ambulatory referral to Social Work  2. Squamous cell skin cancer, face  C44.320     CHIEF COMPLAINT: Here to discuss management of skin cancer  HISTORY OF PRESENT ILLNESS::[name] is a 86 y.o. male who has a history of multiple skin cancers treated by dermatology.  He has never undergone radiotherapy.  Dr. Sarajane Jews performs his skin surgeries.  Most recently he was seen by Dr. Jarome Matin who determined that he had a suspicious nodule in the left preauricular region at the site of previous Mohs.  This area was biopsied, revealing recurrent disease as demonstrated below in the pathology report 05-30-20:       Past skin cancers, if any:   History of Blistering sunburns, if any: Yes, as a young man  SAFETY ISSUES:  Prior radiation? 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 No  Possible current pregnancy? N/A  Is the patient on methotrexate? No  Current Complaints / other details:  Nothing of note.  He wears hearing aids.  He lives with his wife.  He is here today with his daughter.  They both live in [place].  Patient is ambulatory and able to drive.  He denies any facial droop, weakness, or diffuse numbness.  He does have some numbness at the previous sites of Mohs surgery.  He reports that he sometimes drools and has somewhat copious saliva.  Photos from dermatology:   PREVIOUS RADIATION THERAPY: No  PAST MEDICAL HISTORY:  has a past medical history of A-fib ([place]),  Chondrocalcinosis of knee, DM (diabetes mellitus) ([place]), ED (erectile dysfunction), Elevated LFTs, Fall (2013), GERD (gastroesophageal reflux disease), H. pylori infection (1992), Hearing aid worn, Hypertension (1994), Insomnia, Irregular heart beat, Lumbar radiculopathy (2010), Melanoma ([place]), OA (osteoarthritis) of knee, Prostatism, Rib fracture (2013), SOB (shortness of breath), Tinnitus, Trochanteric bursitis (2010), and Urinary incontinence.    PAST SURGICAL HISTORY: Past Surgical History:  Procedure Laterality Date  . CATARACT EXTRACTION    . CHOLECYSTECTOMY    . TONSILLECTOMY      FAMILY HISTORY: family history includes Diabetes in his father; Pancreatic cancer in his sister; Prostate cancer in his brother.  SOCIAL HISTORY:  reports that he has quit smoking. His smoking use included cigars. He has never used smokeless tobacco. He reports current alcohol use. He reports that he does not use drugs.  ALLERGIES: Penicillins  MEDICATIONS:  Current Outpatient Medications  Medication Sig Dispense Refill  . Acetaminophen (TYLENOL ARTHRITIS PAIN PO) Take by mouth. TAKE ONE TABLETS EVERY NIGHT    . atorvastatin (LIPITOR) 40 MG tablet Take 40 mg by mouth daily.    . Cyanocobalamin (VITAMIN B 12 PO) Take 1,000 mg by mouth.    . ENALAPRIL MALEATE PO Take 20 mg by mouth.    . fenofibrate (TRICOR) 145 MG tablet Take 145 mg by mouth daily.    . furosemide (LASIX) 40 MG tablet Take 40 mg by mouth daily.    Marland Kitchen glipiZIDE (GLUCOTROL) 5 MG tablet Take 2.5 mg by mouth daily before breakfast.    .  hydrochlorothiazide (HYDRODIURIL) 25 MG tablet Take 25 mg by mouth daily.    . metFORMIN (GLUCOPHAGE) 500 MG tablet Take 500 mg by mouth 2 (two) times daily with a meal.    . metoprolol succinate (TOPROL-XL) 50 MG 24 hr tablet Take 1 tablet (50 mg total) by mouth daily. 90 tablet 3  . MULTIPLE VITAMIN PO Take by mouth daily.    . Multiple Vitamins-Minerals (RA VISION-VITE PRESERVE PO) Take by mouth 2 (two)  times daily.    . pantoprazole (PROTONIX) 40 MG tablet Take 40 mg by mouth daily.    Marland Kitchen PIOGLITAZONE HCL PO Take 40 mg by mouth daily.    . potassium chloride (KLOR-CON) 10 MEQ tablet Take 10 mEq by mouth daily.    . rivaroxaban (XARELTO) 10 MG TABS tablet Take 1 tablet (10 mg total) by mouth daily. 90 tablet 1  . zolpidem (AMBIEN) 10 MG tablet Take 10 mg by mouth at bedtime as needed for sleep.     No current facility-administered medications for this encounter.    REVIEW OF SYSTEMS:  Notable for that above.   PHYSICAL EXAM:  height is 5\' 10"  (1.778 m) and weight is 226 lb 4 oz (102.6 kg). His oral temperature is 98.2 F (36.8 C). His blood pressure is 134/46 (abnormal) and his pulse is 81. His respiration is 18 and oxygen saturation is 95%.   General: Alert and oriented, in no acute distress HEENT: Head is normocephalic. Extraocular movements are intact.  Oral cavity is clear. + hearing aids Neck: Neck is supple,  no palpable cervical or supraclavicular lymphadenopathy; in the left periauricular region there is a flat subcutaneous nodule that is approximately 1-1/2 to 2 cm in dimension. Skin: evidence of chronic sun damage; there is crusting at the previous biopsy site in the left preauricular region Musculoskeletal: Ambulatory  neurologic: Cranial nerves II through XII are grossly intact.  Sensation grossly intact over the face.  No facial droop  Psychiatric: Judgment and insight are intact. Affect is appropriate.   ECOG = 1  0 - Asymptomatic (Fully active, able to carry on all predisease activities without restriction)  1 - Symptomatic but completely ambulatory (Restricted in physically strenuous activity but ambulatory and able to carry out work of a light or sedentary nature. For example, light housework, office work)  2 - Symptomatic, <50% in bed during the day (Ambulatory and capable of all self care but unable to carry out any work activities. Up and about more than 50% of waking  hours)  3 - Symptomatic, >50% in bed, but not bedbound (Capable of only limited self-care, confined to bed or chair 50% or more of waking hours)  4 - Bedbound (Completely disabled. Cannot carry on any self-care. Totally confined to bed or chair)  5 - Death   Eustace Pen MM, Creech RH, Tormey DC, et al. [phone]). "Toxicity and response criteria of the [facility]". [place] Oncol. 5 (6): 649-55   LABORATORY DATA:  Lab Results  Component Value Date   WBC 4.6 11/05/2018   HGB 10.4 (L) 11/05/2018   HCT 32.6 (L) 11/05/2018   MCV 92 11/05/2018   PLT 349 11/05/2018   CMP  No results found for: NA, K, CL, CO2, GLUCOSE, BUN, CREATININE, CALCIUM, PROT, ALBUMIN, AST, ALT, ALKPHOS, BILITOT, GFRNONAA, GFRAA      RADIOGRAPHY: No results found.    IMPRESSION/PLAN: This is a lovely 86 year old gentleman with recurrent skin cancer in the left preauricular region, status post  Mohs.  I suspect it is a subcutaneous skin recurrence and not a node, based on path and exam.  Dr. Ronnald Ramp has concerns about the potential morbidity of surgery including facial nerve damage.  I think that radiotherapy is a reasonable alternative.  We talked about a 4 week course of hypofractionated radiotherapy to the left preauricular region with electrons.  I would use CT planning in this process.  The patient and his daughter and I also discussed side effects of treatment which may include but not necessarily be limited to fatigue, skin irritation, peeling of the skin, salivary changes.  There is potential of dry mouth in the future and permanent hair loss or skin changes in the radiation field.  Patient is enthusiastic about proceeding with treatment.  Consent form has been signed and we will perform treatment planning within the next week.  On date of service, in total, I spent 60 minutes on this encounter. Patient was seen in person.  __________________________________________   Eppie Gibson, MD

## 2020-06-20 ENCOUNTER — Ambulatory Visit
Admission: RE | Admit: 2020-06-20 | Discharge: 2020-06-20 | Disposition: A | Discharge status: Home / Self Care | Payer: Medicare Other | Source: Ambulatory Visit | Attending: Radiation Oncology | Admitting: Radiation Oncology

## 2020-06-20 ENCOUNTER — Other Ambulatory Visit: Payer: Self-pay

## 2020-06-20 DIAGNOSIS — Z510 Encounter for antineoplastic radiation therapy: Secondary | ICD-10-CM | POA: Insufficient documentation

## 2020-06-20 DIAGNOSIS — C44329 Squamous cell carcinoma of skin of other parts of face: Secondary | ICD-10-CM | POA: Diagnosis not present

## 2020-06-20 DIAGNOSIS — C44320 Squamous cell carcinoma of skin of unspecified parts of face: Secondary | ICD-10-CM | POA: Diagnosis not present

## 2020-06-27 ENCOUNTER — Ambulatory Visit
Admission: RE | Admit: 2020-06-27 | Discharge: 2020-06-27 | Disposition: A | Discharge status: Home / Self Care | Payer: Medicare Other | Source: Ambulatory Visit | Attending: Radiation Oncology | Admitting: Radiation Oncology

## 2020-06-27 ENCOUNTER — Other Ambulatory Visit: Payer: Self-pay

## 2020-06-27 DIAGNOSIS — C44320 Squamous cell carcinoma of skin of unspecified parts of face: Secondary | ICD-10-CM | POA: Diagnosis not present

## 2020-06-27 DIAGNOSIS — Z510 Encounter for antineoplastic radiation therapy: Secondary | ICD-10-CM | POA: Diagnosis not present

## 2020-06-27 DIAGNOSIS — C44329 Squamous cell carcinoma of skin of other parts of face: Secondary | ICD-10-CM | POA: Diagnosis not present

## 2020-06-28 ENCOUNTER — Other Ambulatory Visit: Payer: Self-pay

## 2020-06-28 ENCOUNTER — Ambulatory Visit
Admission: RE | Admit: 2020-06-28 | Discharge: 2020-06-28 | Disposition: A | Discharge status: Home / Self Care | Payer: Medicare Other | Source: Ambulatory Visit | Attending: Radiation Oncology | Admitting: Radiation Oncology

## 2020-06-28 DIAGNOSIS — N183 Chronic kidney disease, stage 3 unspecified: Secondary | ICD-10-CM | POA: Diagnosis not present

## 2020-06-28 DIAGNOSIS — G4700 Insomnia, unspecified: Secondary | ICD-10-CM | POA: Diagnosis not present

## 2020-06-28 DIAGNOSIS — E782 Mixed hyperlipidemia: Secondary | ICD-10-CM | POA: Diagnosis not present

## 2020-06-28 DIAGNOSIS — M1990 Unspecified osteoarthritis, unspecified site: Secondary | ICD-10-CM | POA: Diagnosis not present

## 2020-06-28 DIAGNOSIS — I4891 Unspecified atrial fibrillation: Secondary | ICD-10-CM | POA: Diagnosis not present

## 2020-06-28 DIAGNOSIS — E11319 Type 2 diabetes mellitus with unspecified diabetic retinopathy without macular edema: Secondary | ICD-10-CM | POA: Diagnosis not present

## 2020-06-28 DIAGNOSIS — C44320 Squamous cell carcinoma of skin of unspecified parts of face: Secondary | ICD-10-CM | POA: Diagnosis not present

## 2020-06-28 DIAGNOSIS — I10 Essential (primary) hypertension: Secondary | ICD-10-CM | POA: Diagnosis not present

## 2020-06-28 DIAGNOSIS — Z510 Encounter for antineoplastic radiation therapy: Secondary | ICD-10-CM | POA: Diagnosis not present

## 2020-06-29 ENCOUNTER — Ambulatory Visit
Admission: RE | Admit: 2020-06-29 | Discharge: 2020-06-29 | Disposition: A | Discharge status: Home / Self Care | Payer: Medicare Other | Source: Ambulatory Visit | Attending: Radiation Oncology | Admitting: Radiation Oncology

## 2020-06-29 ENCOUNTER — Other Ambulatory Visit: Payer: Self-pay

## 2020-06-29 DIAGNOSIS — C44320 Squamous cell carcinoma of skin of unspecified parts of face: Secondary | ICD-10-CM | POA: Insufficient documentation

## 2020-06-29 DIAGNOSIS — C44329 Squamous cell carcinoma of skin of other parts of face: Secondary | ICD-10-CM | POA: Diagnosis not present

## 2020-06-29 DIAGNOSIS — Z510 Encounter for antineoplastic radiation therapy: Secondary | ICD-10-CM | POA: Insufficient documentation

## 2020-06-29 MED ORDER — SONAFINE EX EMUL
1.0000 "application " | Freq: Two times a day (BID) | CUTANEOUS | Status: DC
  Administered 2020-06-29: 1 via TOPICAL

## 2020-06-29 NOTE — Progress Notes (Signed)
Pt here for patient teaching.    Pt given Radiation and You booklet, Managing Acute Radiation Side Effects for Head and Neck Cancer handout, skin care instructions and Sonafine.    Reviewed areas of pertinence such as fatigue, hair loss, mouth changes, skin changes, earaches and taste changes .   Pt able to give teach back of to pat skin, use unscented/gentle soap and drink plenty of water,apply Sonafine bid, avoid applying anything to skin within 4 hours of treatment and to use an electric razor if they must shave.   Pt demonstrated understanding and verbalizes understanding of information given and will contact nursing with any questions or concerns.    [email or web address]/treatmentinformation/whattoexpect/index

## 2020-06-30 ENCOUNTER — Ambulatory Visit
Admission: RE | Admit: 2020-06-30 | Discharge: 2020-06-30 | Disposition: A | Discharge status: Home / Self Care | Payer: Medicare Other | Source: Ambulatory Visit | Attending: Radiation Oncology | Admitting: Radiation Oncology

## 2020-06-30 ENCOUNTER — Other Ambulatory Visit: Payer: Self-pay

## 2020-06-30 DIAGNOSIS — Z510 Encounter for antineoplastic radiation therapy: Secondary | ICD-10-CM | POA: Diagnosis not present

## 2020-06-30 DIAGNOSIS — C44329 Squamous cell carcinoma of skin of other parts of face: Secondary | ICD-10-CM | POA: Diagnosis not present

## 2020-06-30 DIAGNOSIS — C44320 Squamous cell carcinoma of skin of unspecified parts of face: Secondary | ICD-10-CM | POA: Diagnosis not present

## 2020-07-01 ENCOUNTER — Ambulatory Visit
Admission: RE | Admit: 2020-07-01 | Discharge: 2020-07-01 | Disposition: A | Discharge status: Home / Self Care | Payer: Medicare Other | Source: Ambulatory Visit | Attending: Radiation Oncology | Admitting: Radiation Oncology

## 2020-07-01 DIAGNOSIS — C44329 Squamous cell carcinoma of skin of other parts of face: Secondary | ICD-10-CM | POA: Diagnosis not present

## 2020-07-01 DIAGNOSIS — C44320 Squamous cell carcinoma of skin of unspecified parts of face: Secondary | ICD-10-CM | POA: Diagnosis not present

## 2020-07-01 DIAGNOSIS — Z510 Encounter for antineoplastic radiation therapy: Secondary | ICD-10-CM | POA: Diagnosis not present

## 2020-07-05 ENCOUNTER — Ambulatory Visit
Admission: RE | Admit: 2020-07-05 | Discharge: 2020-07-05 | Disposition: A | Discharge status: Home / Self Care | Payer: Medicare Other | Source: Ambulatory Visit | Attending: Radiation Oncology | Admitting: Radiation Oncology

## 2020-07-05 ENCOUNTER — Other Ambulatory Visit: Payer: Self-pay

## 2020-07-05 DIAGNOSIS — C44329 Squamous cell carcinoma of skin of other parts of face: Secondary | ICD-10-CM | POA: Diagnosis not present

## 2020-07-05 DIAGNOSIS — Z510 Encounter for antineoplastic radiation therapy: Secondary | ICD-10-CM | POA: Diagnosis not present

## 2020-07-05 DIAGNOSIS — C44320 Squamous cell carcinoma of skin of unspecified parts of face: Secondary | ICD-10-CM | POA: Diagnosis not present

## 2020-07-06 ENCOUNTER — Other Ambulatory Visit: Payer: Self-pay

## 2020-07-06 ENCOUNTER — Ambulatory Visit
Admission: RE | Admit: 2020-07-06 | Discharge: 2020-07-06 | Disposition: A | Discharge status: Home / Self Care | Payer: Medicare Other | Source: Ambulatory Visit | Attending: Radiation Oncology | Admitting: Radiation Oncology

## 2020-07-06 DIAGNOSIS — Z510 Encounter for antineoplastic radiation therapy: Secondary | ICD-10-CM | POA: Diagnosis not present

## 2020-07-06 DIAGNOSIS — C44329 Squamous cell carcinoma of skin of other parts of face: Secondary | ICD-10-CM | POA: Diagnosis not present

## 2020-07-06 DIAGNOSIS — C44320 Squamous cell carcinoma of skin of unspecified parts of face: Secondary | ICD-10-CM | POA: Diagnosis not present

## 2020-07-07 ENCOUNTER — Other Ambulatory Visit: Payer: Self-pay

## 2020-07-07 ENCOUNTER — Ambulatory Visit
Admission: RE | Admit: 2020-07-07 | Discharge: 2020-07-07 | Disposition: A | Discharge status: Home / Self Care | Payer: Medicare Other | Source: Ambulatory Visit | Attending: Radiation Oncology | Admitting: Radiation Oncology

## 2020-07-07 DIAGNOSIS — Z510 Encounter for antineoplastic radiation therapy: Secondary | ICD-10-CM | POA: Diagnosis not present

## 2020-07-07 DIAGNOSIS — C44329 Squamous cell carcinoma of skin of other parts of face: Secondary | ICD-10-CM | POA: Diagnosis not present

## 2020-07-07 DIAGNOSIS — C44320 Squamous cell carcinoma of skin of unspecified parts of face: Secondary | ICD-10-CM | POA: Diagnosis not present

## 2020-07-08 ENCOUNTER — Other Ambulatory Visit: Payer: Self-pay

## 2020-07-08 ENCOUNTER — Ambulatory Visit
Admission: RE | Admit: 2020-07-08 | Discharge: 2020-07-08 | Disposition: A | Discharge status: Home / Self Care | Payer: Medicare Other | Source: Ambulatory Visit | Attending: Radiation Oncology | Admitting: Radiation Oncology

## 2020-07-08 DIAGNOSIS — Z510 Encounter for antineoplastic radiation therapy: Secondary | ICD-10-CM | POA: Diagnosis not present

## 2020-07-08 DIAGNOSIS — C44329 Squamous cell carcinoma of skin of other parts of face: Secondary | ICD-10-CM | POA: Diagnosis not present

## 2020-07-08 DIAGNOSIS — C44320 Squamous cell carcinoma of skin of unspecified parts of face: Secondary | ICD-10-CM | POA: Diagnosis not present

## 2020-07-11 ENCOUNTER — Ambulatory Visit
Admission: RE | Admit: 2020-07-11 | Discharge: 2020-07-11 | Disposition: A | Discharge status: Home / Self Care | Payer: Medicare Other | Source: Ambulatory Visit | Attending: Radiation Oncology | Admitting: Radiation Oncology

## 2020-07-11 ENCOUNTER — Other Ambulatory Visit: Payer: Self-pay

## 2020-07-11 DIAGNOSIS — C44329 Squamous cell carcinoma of skin of other parts of face: Secondary | ICD-10-CM | POA: Diagnosis not present

## 2020-07-11 DIAGNOSIS — Z510 Encounter for antineoplastic radiation therapy: Secondary | ICD-10-CM | POA: Diagnosis not present

## 2020-07-11 DIAGNOSIS — C44320 Squamous cell carcinoma of skin of unspecified parts of face: Secondary | ICD-10-CM | POA: Diagnosis not present

## 2020-07-12 ENCOUNTER — Ambulatory Visit
Admission: RE | Admit: 2020-07-12 | Discharge: 2020-07-12 | Disposition: A | Discharge status: Home / Self Care | Payer: Medicare Other | Source: Ambulatory Visit | Attending: Radiation Oncology | Admitting: Radiation Oncology

## 2020-07-12 ENCOUNTER — Other Ambulatory Visit: Payer: Self-pay

## 2020-07-12 DIAGNOSIS — Z510 Encounter for antineoplastic radiation therapy: Secondary | ICD-10-CM | POA: Diagnosis not present

## 2020-07-12 DIAGNOSIS — C44329 Squamous cell carcinoma of skin of other parts of face: Secondary | ICD-10-CM | POA: Diagnosis not present

## 2020-07-12 DIAGNOSIS — C44320 Squamous cell carcinoma of skin of unspecified parts of face: Secondary | ICD-10-CM | POA: Diagnosis not present

## 2020-07-13 ENCOUNTER — Other Ambulatory Visit: Payer: Self-pay

## 2020-07-13 ENCOUNTER — Ambulatory Visit
Admission: RE | Admit: 2020-07-13 | Discharge: 2020-07-13 | Disposition: A | Discharge status: Home / Self Care | Payer: Medicare Other | Source: Ambulatory Visit | Attending: Radiation Oncology | Admitting: Radiation Oncology

## 2020-07-13 DIAGNOSIS — C44320 Squamous cell carcinoma of skin of unspecified parts of face: Secondary | ICD-10-CM | POA: Diagnosis not present

## 2020-07-13 DIAGNOSIS — Z510 Encounter for antineoplastic radiation therapy: Secondary | ICD-10-CM | POA: Diagnosis not present

## 2020-07-13 DIAGNOSIS — C44329 Squamous cell carcinoma of skin of other parts of face: Secondary | ICD-10-CM | POA: Diagnosis not present

## 2020-07-14 ENCOUNTER — Other Ambulatory Visit: Payer: Self-pay

## 2020-07-14 ENCOUNTER — Ambulatory Visit
Admission: RE | Admit: 2020-07-14 | Discharge: 2020-07-14 | Disposition: A | Discharge status: Home / Self Care | Payer: Medicare Other | Source: Ambulatory Visit | Attending: Radiation Oncology | Admitting: Radiation Oncology

## 2020-07-14 DIAGNOSIS — C44320 Squamous cell carcinoma of skin of unspecified parts of face: Secondary | ICD-10-CM | POA: Diagnosis not present

## 2020-07-14 DIAGNOSIS — C44329 Squamous cell carcinoma of skin of other parts of face: Secondary | ICD-10-CM | POA: Diagnosis not present

## 2020-07-14 DIAGNOSIS — Z510 Encounter for antineoplastic radiation therapy: Secondary | ICD-10-CM | POA: Diagnosis not present

## 2020-07-15 ENCOUNTER — Ambulatory Visit
Admission: RE | Admit: 2020-07-15 | Discharge: 2020-07-15 | Disposition: A | Discharge status: Home / Self Care | Payer: Medicare Other | Source: Ambulatory Visit | Attending: Radiation Oncology | Admitting: Radiation Oncology

## 2020-07-15 ENCOUNTER — Other Ambulatory Visit: Payer: Self-pay

## 2020-07-15 DIAGNOSIS — C44329 Squamous cell carcinoma of skin of other parts of face: Secondary | ICD-10-CM | POA: Diagnosis not present

## 2020-07-15 DIAGNOSIS — C44320 Squamous cell carcinoma of skin of unspecified parts of face: Secondary | ICD-10-CM | POA: Diagnosis not present

## 2020-07-15 DIAGNOSIS — Z510 Encounter for antineoplastic radiation therapy: Secondary | ICD-10-CM | POA: Diagnosis not present

## 2020-07-18 ENCOUNTER — Ambulatory Visit
Admission: RE | Admit: 2020-07-18 | Discharge: 2020-07-18 | Disposition: A | Discharge status: Home / Self Care | Payer: Medicare Other | Source: Ambulatory Visit | Attending: Radiation Oncology | Admitting: Radiation Oncology

## 2020-07-18 ENCOUNTER — Other Ambulatory Visit: Payer: Self-pay

## 2020-07-18 DIAGNOSIS — Z510 Encounter for antineoplastic radiation therapy: Secondary | ICD-10-CM | POA: Diagnosis not present

## 2020-07-18 DIAGNOSIS — C44320 Squamous cell carcinoma of skin of unspecified parts of face: Secondary | ICD-10-CM | POA: Diagnosis not present

## 2020-07-18 DIAGNOSIS — C44329 Squamous cell carcinoma of skin of other parts of face: Secondary | ICD-10-CM | POA: Diagnosis not present

## 2020-07-19 ENCOUNTER — Other Ambulatory Visit: Payer: Self-pay

## 2020-07-19 ENCOUNTER — Ambulatory Visit
Admission: RE | Admit: 2020-07-19 | Discharge: 2020-07-19 | Disposition: A | Discharge status: Home / Self Care | Payer: Medicare Other | Source: Ambulatory Visit | Attending: Radiation Oncology | Admitting: Radiation Oncology

## 2020-07-19 DIAGNOSIS — C44329 Squamous cell carcinoma of skin of other parts of face: Secondary | ICD-10-CM | POA: Diagnosis not present

## 2020-07-19 DIAGNOSIS — C44320 Squamous cell carcinoma of skin of unspecified parts of face: Secondary | ICD-10-CM | POA: Diagnosis not present

## 2020-07-19 DIAGNOSIS — Z510 Encounter for antineoplastic radiation therapy: Secondary | ICD-10-CM | POA: Diagnosis not present

## 2020-07-20 ENCOUNTER — Other Ambulatory Visit: Payer: Self-pay

## 2020-07-20 ENCOUNTER — Ambulatory Visit
Admission: RE | Admit: 2020-07-20 | Discharge: 2020-07-20 | Disposition: A | Discharge status: Home / Self Care | Payer: Medicare Other | Source: Ambulatory Visit | Attending: Radiation Oncology | Admitting: Radiation Oncology

## 2020-07-20 DIAGNOSIS — C44320 Squamous cell carcinoma of skin of unspecified parts of face: Secondary | ICD-10-CM | POA: Diagnosis not present

## 2020-07-20 DIAGNOSIS — Z510 Encounter for antineoplastic radiation therapy: Secondary | ICD-10-CM | POA: Diagnosis not present

## 2020-07-20 DIAGNOSIS — C44329 Squamous cell carcinoma of skin of other parts of face: Secondary | ICD-10-CM | POA: Diagnosis not present

## 2020-07-21 ENCOUNTER — Other Ambulatory Visit: Payer: Self-pay

## 2020-07-21 ENCOUNTER — Ambulatory Visit
Admission: RE | Admit: 2020-07-21 | Discharge: 2020-07-21 | Disposition: A | Discharge status: Home / Self Care | Payer: Medicare Other | Source: Ambulatory Visit | Attending: Radiation Oncology | Admitting: Radiation Oncology

## 2020-07-21 DIAGNOSIS — C44320 Squamous cell carcinoma of skin of unspecified parts of face: Secondary | ICD-10-CM | POA: Diagnosis not present

## 2020-07-21 DIAGNOSIS — C44329 Squamous cell carcinoma of skin of other parts of face: Secondary | ICD-10-CM | POA: Diagnosis not present

## 2020-07-21 DIAGNOSIS — Z510 Encounter for antineoplastic radiation therapy: Secondary | ICD-10-CM | POA: Diagnosis not present

## 2020-07-22 ENCOUNTER — Ambulatory Visit: Payer: Medicare Other

## 2020-07-25 ENCOUNTER — Ambulatory Visit: Payer: Medicare Other

## 2020-07-25 ENCOUNTER — Other Ambulatory Visit: Payer: Self-pay

## 2020-07-25 ENCOUNTER — Ambulatory Visit
Admission: RE | Admit: 2020-07-25 | Discharge: 2020-07-25 | Disposition: A | Discharge status: Home / Self Care | Payer: Medicare Other | Source: Ambulatory Visit | Attending: Radiation Oncology | Admitting: Radiation Oncology

## 2020-07-25 DIAGNOSIS — C44320 Squamous cell carcinoma of skin of unspecified parts of face: Secondary | ICD-10-CM | POA: Diagnosis not present

## 2020-07-25 DIAGNOSIS — C44329 Squamous cell carcinoma of skin of other parts of face: Secondary | ICD-10-CM | POA: Diagnosis not present

## 2020-07-25 DIAGNOSIS — Z510 Encounter for antineoplastic radiation therapy: Secondary | ICD-10-CM | POA: Diagnosis not present

## 2020-07-26 ENCOUNTER — Ambulatory Visit
Admission: RE | Admit: 2020-07-26 | Discharge: 2020-07-26 | Disposition: A | Discharge status: Home / Self Care | Payer: Medicare Other | Source: Ambulatory Visit | Attending: Radiation Oncology | Admitting: Radiation Oncology

## 2020-07-26 ENCOUNTER — Encounter: Payer: Self-pay | Admitting: Radiation Oncology

## 2020-07-26 DIAGNOSIS — N183 Chronic kidney disease, stage 3 unspecified: Secondary | ICD-10-CM | POA: Diagnosis not present

## 2020-07-26 DIAGNOSIS — I10 Essential (primary) hypertension: Secondary | ICD-10-CM | POA: Diagnosis not present

## 2020-07-26 DIAGNOSIS — E782 Mixed hyperlipidemia: Secondary | ICD-10-CM | POA: Diagnosis not present

## 2020-07-26 DIAGNOSIS — M1990 Unspecified osteoarthritis, unspecified site: Secondary | ICD-10-CM | POA: Diagnosis not present

## 2020-07-26 DIAGNOSIS — C44320 Squamous cell carcinoma of skin of unspecified parts of face: Secondary | ICD-10-CM | POA: Diagnosis not present

## 2020-07-26 DIAGNOSIS — E11319 Type 2 diabetes mellitus with unspecified diabetic retinopathy without macular edema: Secondary | ICD-10-CM | POA: Diagnosis not present

## 2020-07-26 DIAGNOSIS — I4891 Unspecified atrial fibrillation: Secondary | ICD-10-CM | POA: Diagnosis not present

## 2020-07-26 DIAGNOSIS — Z510 Encounter for antineoplastic radiation therapy: Secondary | ICD-10-CM | POA: Diagnosis not present

## 2020-07-26 DIAGNOSIS — G4700 Insomnia, unspecified: Secondary | ICD-10-CM | POA: Diagnosis not present

## 2020-07-26 DIAGNOSIS — C44329 Squamous cell carcinoma of skin of other parts of face: Secondary | ICD-10-CM | POA: Diagnosis not present

## 2020-08-03 DIAGNOSIS — Z23 Encounter for immunization: Secondary | ICD-10-CM | POA: Diagnosis not present

## 2020-08-09 DIAGNOSIS — Z23 Encounter for immunization: Secondary | ICD-10-CM | POA: Diagnosis not present
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
## 2020-08-30 ENCOUNTER — Ambulatory Visit
Admission: RE | Admit: 2020-08-30 | Discharge: 2020-08-30 | Disposition: A | Discharge status: Home / Self Care | Payer: Medicare Other | Source: Ambulatory Visit | Attending: Radiation Oncology | Admitting: Radiation Oncology

## 2020-08-30 ENCOUNTER — Encounter: Payer: Self-pay | Admitting: Radiation Oncology

## 2020-08-30 ENCOUNTER — Other Ambulatory Visit: Payer: Self-pay

## 2020-08-30 VITALS — BP 151/86 | HR 62 | Temp 97.4°F | Resp 18 | Ht 70.0 in | Wt 224.1 lb

## 2020-08-30 DIAGNOSIS — Z7901 Long term (current) use of anticoagulants: Secondary | ICD-10-CM | POA: Diagnosis not present

## 2020-08-30 DIAGNOSIS — Z79899 Other long term (current) drug therapy: Secondary | ICD-10-CM | POA: Insufficient documentation

## 2020-08-30 DIAGNOSIS — Z7984 Long term (current) use of oral hypoglycemic drugs: Secondary | ICD-10-CM | POA: Insufficient documentation

## 2020-08-30 DIAGNOSIS — C44329 Squamous cell carcinoma of skin of other parts of face: Secondary | ICD-10-CM | POA: Insufficient documentation

## 2020-08-30 DIAGNOSIS — Z923 Personal history of irradiation: Secondary | ICD-10-CM | POA: Diagnosis not present

## 2020-08-30 MED ORDER — SONAFINE EX EMUL
1.0000 "application " | Freq: Two times a day (BID) | CUTANEOUS | Status: DC
  Administered 2020-08-30: 1 via TOPICAL

## 2020-08-30 NOTE — Progress Notes (Signed)
[name] presents today for follow-up of radiation to his left preauricular region completed on 07/26/2020  Pain: patient deines. He reports occasional itching inside his left ear, but otherwise no issues Skin: Reports treatment field has healed nicely. States skin is smooth and no longer weeps. On assessment skin looks intact and uniform in color Appetite: Patient reports a healthy appetite Dermatology F/U: Reports he had not seen Dr. Jarome Matin since completing treatment, but is eager for F/U once cleared by Dr. Isidore Moos. Other issues of note: Wife is doing well since cardiac surgery, and patient reports they are getting back into their old routine. They are hoping to travel to [facility] this winter once she is done with all her follow-up appointments. Overall he feels well and is pleased with his recovery thus far.  Vitals:   08/30/20 1458  BP: (!) 151/86  Pulse: 62  Resp: 18  Temp: (!) 97.4 F (36.3 C)  SpO2: 98%   Wt Readings from Last 3 Encounters:  08/30/20 224 lb 2 oz (101.7 kg)  06/15/20 226 lb 4 oz (102.6 kg)  03/17/20 226 lb (102.5 kg)

## 2020-08-30 NOTE — Progress Notes (Signed)
Radiation Oncology         [phone] ________________________________  Name: [name] MRN: [id number]  Date: 08/30/2020  DOB: 1931-02-12  Follow-Up Visit Note  Outpatient  CC: Wenda Low, MD  Danella Sensing, MD  Diagnosis and Prior Radiotherapy:    ICD-10-CM   1. Squamous cell carcinoma of preauricular region  C44.329 Sonafine emulsion 1 application    Radiation Treatment Dates: 06/27/2020 through 07/26/2020 Site Technique Total Dose (Gy) Dose per Fx (Gy) Completed Fx Beam Energies  Cheek, Left: HN_Lt_preAuri specialPort 50/50 2.5 20/20 6E, 9E    CHIEF COMPLAINT: Here for follow-up and surveillance of skin cancer  Narrative:  The patient returns today for routine follow-up.   [name] presents today for follow-up of radiation to his left preauricular region completed on 07/26/2020  Pain: patient deines. He reports occasional itching inside his left ear, but otherwise no issues Skin: Reports treatment field has healed nicely. States skin is smooth and no longer weeps.  Appetite: Patient reports a healthy appetite Dermatology F/U: Reports he had not seen Dr. Jarome Matin since completing treatment, but is eager for F/U once cleared by rad onc. Other issues of note: Wife is doing well since cardiac surgery, and patient reports they are getting back into their old routine. They are hoping to travel to [facility] this winter once she is done with all her follow-up appointments. Overall he feels well and is pleased with his recovery thus far.  Vitals:   08/30/20 1458  BP: (!) 151/86  Pulse: 62  Resp: 18  Temp: (!) 97.4 F (36.3 C)  SpO2: 98%   Wt Readings from Last 3 Encounters:  08/30/20 224 lb 2 oz (101.7 kg)  06/15/20 226 lb 4 oz (102.6 kg)  03/17/20 226 lb (102.5 kg)                                 ALLERGIES:  is allergic to penicillins.  Meds: Current Outpatient Medications  Medication Sig Dispense Refill  . Acetaminophen (TYLENOL ARTHRITIS PAIN PO) Take by  mouth. TAKE ONE TABLETS EVERY NIGHT    . atorvastatin (LIPITOR) 40 MG tablet Take 40 mg by mouth daily.    . Cyanocobalamin (VITAMIN B 12 PO) Take 1,000 mg by mouth.    . ENALAPRIL MALEATE PO Take 20 mg by mouth.    . fenofibrate (TRICOR) 145 MG tablet Take 145 mg by mouth daily.    . furosemide (LASIX) 40 MG tablet Take 40 mg by mouth daily.    Marland Kitchen glipiZIDE (GLUCOTROL) 5 MG tablet Take 2.5 mg by mouth daily before breakfast.    . hydrochlorothiazide (HYDRODIURIL) 25 MG tablet Take 25 mg by mouth daily.    . metFORMIN (GLUCOPHAGE) 500 MG tablet Take 500 mg by mouth 2 (two) times daily with a meal.    . metoprolol succinate (TOPROL-XL) 50 MG 24 hr tablet Take 1 tablet (50 mg total) by mouth daily. 90 tablet 3  . MULTIPLE VITAMIN PO Take by mouth daily.    . Multiple Vitamins-Minerals (RA VISION-VITE PRESERVE PO) Take by mouth 2 (two) times daily.    . pantoprazole (PROTONIX) 40 MG tablet Take 40 mg by mouth daily.    Marland Kitchen PIOGLITAZONE HCL PO Take 40 mg by mouth daily.    . potassium chloride (KLOR-CON) 10 MEQ tablet Take 10 mEq by mouth daily.    . rivaroxaban (XARELTO) 10 MG TABS tablet Take  1 tablet (10 mg total) by mouth daily. 90 tablet 1  . zolpidem (AMBIEN) 10 MG tablet Take 10 mg by mouth at bedtime as needed for sleep.     Current Facility-Administered Medications  Medication Dose Route Frequency Provider Last Rate Last Admin  . Sonafine emulsion 1 application  1 application Topical BID Eppie Gibson, MD   1 application at 53/97/67 1553    Physical Findings: The patient is in no acute distress. Patient is alert and oriented.  height is 5\' 10"  (1.778 m) and weight is 224 lb 2 oz (101.7 kg). His temporal temperature is 97.4 F (36.3 C) (abnormal). His blood pressure is 151/86 (abnormal) and his pulse is 62. His respiration is 18 and oxygen saturation is 98%. .    The skin in the treatment field of the left preauricular region has healed beautifully.  It is smooth and slightly  erythematous.  The subcutaneous mass has completely regressed.  There is no residual ulceration or desquamation..  Lab Findings: Lab Results  Component Value Date   WBC 4.6 11/05/2018   HGB 10.4 (L) 11/05/2018   HCT 32.6 (L) 11/05/2018   MCV 92 11/05/2018   PLT 349 11/05/2018    Radiographic Findings: No results found.  Impression/Plan: He has had a complete response to radiation therapy.  I recommend that he follow-up with his dermatologist before he leaves for [place].  He has a history of chronic sun exposure and sun damage to his skin and will need to continue to be monitored closely by dermatology.  I am happy to see him back at anytime in the future if radiation therapy needs to be considered for other lesions of the skin.  I wished him the very best.  At the patient's request, refill of Sonafine lotion has been provided to continue to moisturize and soothe the skin.  We discussed measures to reduce the risk of infection during the COVID-19 pandemic.  He has been vaccinated and has received his booster shot.  On date of service, in total, I spent 20 minutes on this encounter. Patient was seen in person.  _____________________________________   Eppie Gibson, MD

## 2020-09-07 DIAGNOSIS — C44612 Basal cell carcinoma of skin of right upper limb, including shoulder: Secondary | ICD-10-CM | POA: Diagnosis not present

## 2020-09-07 DIAGNOSIS — C44729 Squamous cell carcinoma of skin of left lower limb, including hip: Secondary | ICD-10-CM | POA: Diagnosis not present

## 2020-09-07 DIAGNOSIS — D485 Neoplasm of uncertain behavior of skin: Secondary | ICD-10-CM | POA: Diagnosis not present

## 2020-09-07 DIAGNOSIS — Z85828 Personal history of other malignant neoplasm of skin: Secondary | ICD-10-CM | POA: Diagnosis not present

## 2020-09-07 DIAGNOSIS — L570 Actinic keratosis: Secondary | ICD-10-CM | POA: Diagnosis not present

## 2020-09-08 DIAGNOSIS — R600 Localized edema: Secondary | ICD-10-CM | POA: Diagnosis not present

## 2020-09-08 DIAGNOSIS — M79606 Pain in leg, unspecified: Secondary | ICD-10-CM | POA: Diagnosis not present

## 2020-09-12 NOTE — Progress Notes (Signed)
  Patient Name: [name] MRN: [id number] DOB: [birth year] Referring Physician: Danella Sensing (Profile Not Attached) Date of Service: 07/26/2020 [facility] [place]                                                        End Of Treatment Note  Diagnoses: C44.329-Squamous cell carcinoma of skin of other parts of face  Intent: Curative  Radiation Treatment Dates: 06/27/2020 through 07/26/2020 Site Technique Total Dose (Gy) Dose per Fx (Gy) Completed Fx Beam Energies  Cheek, Left: HN_Lt_preAuri specialPort 50/50 2.5 20/20 6E, 9E   Narrative: The patient tolerated radiation therapy relatively well to the left preauricular mass/skin lesion.  Plan: The patient will follow-up with radiation oncology in 12mo.  -----------------------------------  Eppie Gibson, MD

## 2020-09-27 DIAGNOSIS — K219 Gastro-esophageal reflux disease without esophagitis: Secondary | ICD-10-CM | POA: Diagnosis not present

## 2020-09-27 DIAGNOSIS — I4891 Unspecified atrial fibrillation: Secondary | ICD-10-CM | POA: Diagnosis not present

## 2020-09-27 DIAGNOSIS — E11319 Type 2 diabetes mellitus with unspecified diabetic retinopathy without macular edema: Secondary | ICD-10-CM | POA: Diagnosis not present

## 2020-09-27 DIAGNOSIS — I10 Essential (primary) hypertension: Secondary | ICD-10-CM | POA: Diagnosis not present

## 2020-09-27 DIAGNOSIS — E782 Mixed hyperlipidemia: Secondary | ICD-10-CM | POA: Diagnosis not present

## 2020-09-27 DIAGNOSIS — M1990 Unspecified osteoarthritis, unspecified site: Secondary | ICD-10-CM | POA: Diagnosis not present

## 2020-09-27 DIAGNOSIS — G4700 Insomnia, unspecified: Secondary | ICD-10-CM | POA: Diagnosis not present

## 2020-09-27 DIAGNOSIS — N183 Chronic kidney disease, stage 3 unspecified: Secondary | ICD-10-CM | POA: Diagnosis not present

## 2020-09-30 DIAGNOSIS — H35342 Macular cyst, hole, or pseudohole, left eye: Secondary | ICD-10-CM | POA: Diagnosis not present

## 2020-09-30 DIAGNOSIS — Z961 Presence of intraocular lens: Secondary | ICD-10-CM | POA: Diagnosis not present

## 2020-09-30 DIAGNOSIS — H04123 Dry eye syndrome of bilateral lacrimal glands: Secondary | ICD-10-CM | POA: Diagnosis not present

## 2020-09-30 DIAGNOSIS — E119 Type 2 diabetes mellitus without complications: Secondary | ICD-10-CM | POA: Diagnosis not present

## 2020-09-30 DIAGNOSIS — H524 Presbyopia: Secondary | ICD-10-CM | POA: Diagnosis not present

## 2020-09-30 DIAGNOSIS — H52223 Regular astigmatism, bilateral: Secondary | ICD-10-CM | POA: Diagnosis not present

## 2020-09-30 DIAGNOSIS — H35372 Puckering of macula, left eye: Secondary | ICD-10-CM | POA: Diagnosis not present

## 2020-09-30 DIAGNOSIS — H353132 Nonexudative age-related macular degeneration, bilateral, intermediate dry stage: Secondary | ICD-10-CM | POA: Diagnosis not present

## 2020-10-06 DIAGNOSIS — C44729 Squamous cell carcinoma of skin of left lower limb, including hip: Secondary | ICD-10-CM | POA: Diagnosis not present

## 2020-10-06 DIAGNOSIS — L988 Other specified disorders of the skin and subcutaneous tissue: Secondary | ICD-10-CM | POA: Diagnosis not present

## 2020-10-06 DIAGNOSIS — Z85828 Personal history of other malignant neoplasm of skin: Secondary | ICD-10-CM | POA: Diagnosis not present

## 2020-10-12 DIAGNOSIS — E782 Mixed hyperlipidemia: Secondary | ICD-10-CM | POA: Diagnosis not present

## 2020-10-12 DIAGNOSIS — E11319 Type 2 diabetes mellitus with unspecified diabetic retinopathy without macular edema: Secondary | ICD-10-CM | POA: Diagnosis not present

## 2020-10-12 DIAGNOSIS — G4700 Insomnia, unspecified: Secondary | ICD-10-CM | POA: Diagnosis not present

## 2020-10-12 DIAGNOSIS — I4891 Unspecified atrial fibrillation: Secondary | ICD-10-CM | POA: Diagnosis not present

## 2020-10-12 DIAGNOSIS — I10 Essential (primary) hypertension: Secondary | ICD-10-CM | POA: Diagnosis not present

## 2020-10-12 DIAGNOSIS — D6869 Other thrombophilia: Secondary | ICD-10-CM | POA: Diagnosis not present

## 2020-10-12 DIAGNOSIS — C4490 Unspecified malignant neoplasm of skin, unspecified: Secondary | ICD-10-CM | POA: Diagnosis not present

## 2020-10-12 DIAGNOSIS — I700 Atherosclerosis of aorta: Secondary | ICD-10-CM | POA: Diagnosis not present

## 2020-10-12 DIAGNOSIS — Z7984 Long term (current) use of oral hypoglycemic drugs: Secondary | ICD-10-CM | POA: Diagnosis not present

## 2020-10-12 DIAGNOSIS — N182 Chronic kidney disease, stage 2 (mild): Secondary | ICD-10-CM | POA: Diagnosis not present

## 2020-10-12 DIAGNOSIS — H9190 Unspecified hearing loss, unspecified ear: Secondary | ICD-10-CM | POA: Diagnosis not present

## 2020-10-12 DIAGNOSIS — K219 Gastro-esophageal reflux disease without esophagitis: Secondary | ICD-10-CM | POA: Diagnosis not present

## 2020-10-13 DIAGNOSIS — L0889 Other specified local infections of the skin and subcutaneous tissue: Secondary | ICD-10-CM | POA: Diagnosis not present

## 2020-10-20 DIAGNOSIS — Z4802 Encounter for removal of sutures: Secondary | ICD-10-CM | POA: Diagnosis not present

## 2020-10-24 DIAGNOSIS — R252 Cramp and spasm: Secondary | ICD-10-CM | POA: Diagnosis not present

## 2020-10-31 DIAGNOSIS — M79606 Pain in leg, unspecified: Secondary | ICD-10-CM | POA: Diagnosis not present

## 2020-10-31 DIAGNOSIS — R0602 Shortness of breath: Secondary | ICD-10-CM | POA: Diagnosis not present

## 2020-11-02 DIAGNOSIS — I4891 Unspecified atrial fibrillation: Secondary | ICD-10-CM | POA: Diagnosis not present

## 2020-11-02 DIAGNOSIS — G4700 Insomnia, unspecified: Secondary | ICD-10-CM | POA: Diagnosis not present

## 2020-11-02 DIAGNOSIS — N183 Chronic kidney disease, stage 3 unspecified: Secondary | ICD-10-CM | POA: Diagnosis not present

## 2020-11-02 DIAGNOSIS — M1990 Unspecified osteoarthritis, unspecified site: Secondary | ICD-10-CM | POA: Diagnosis not present

## 2020-11-02 DIAGNOSIS — E782 Mixed hyperlipidemia: Secondary | ICD-10-CM | POA: Diagnosis not present

## 2020-11-02 DIAGNOSIS — K219 Gastro-esophageal reflux disease without esophagitis: Secondary | ICD-10-CM | POA: Diagnosis not present

## 2020-11-02 DIAGNOSIS — I10 Essential (primary) hypertension: Secondary | ICD-10-CM | POA: Diagnosis not present

## 2020-11-08 DIAGNOSIS — R0609 Other forms of dyspnea: Secondary | ICD-10-CM | POA: Diagnosis not present

## 2020-11-08 DIAGNOSIS — I4891 Unspecified atrial fibrillation: Secondary | ICD-10-CM | POA: Diagnosis not present

## 2020-11-09 ENCOUNTER — Encounter: Payer: Self-pay | Admitting: Physician Assistant

## 2020-11-09 ENCOUNTER — Telehealth: Payer: Self-pay | Admitting: Interventional Cardiology

## 2020-11-09 NOTE — Telephone Encounter (Signed)
Encounter not needed
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
## 2020-11-10 ENCOUNTER — Ambulatory Visit
Admission: RE | Admit: 2020-11-10 | Discharge: 2020-11-10 | Disposition: A | Discharge status: Home / Self Care | Payer: Medicare Other | Source: Ambulatory Visit | Attending: Physician Assistant | Admitting: Physician Assistant

## 2020-11-10 ENCOUNTER — Other Ambulatory Visit: Payer: Self-pay

## 2020-11-10 ENCOUNTER — Encounter: Payer: Self-pay | Admitting: Physician Assistant

## 2020-11-10 ENCOUNTER — Ambulatory Visit (INDEPENDENT_AMBULATORY_CARE_PROVIDER_SITE_OTHER): Payer: Medicare Other | Admitting: Physician Assistant

## 2020-11-10 ENCOUNTER — Telehealth: Payer: Self-pay | Admitting: Physician Assistant

## 2020-11-10 VITALS — BP 124/68 | HR 75 | Ht 70.0 in | Wt 213.8 lb

## 2020-11-10 DIAGNOSIS — I10 Essential (primary) hypertension: Secondary | ICD-10-CM | POA: Diagnosis not present

## 2020-11-10 DIAGNOSIS — R634 Abnormal weight loss: Secondary | ICD-10-CM

## 2020-11-10 DIAGNOSIS — R931 Abnormal findings on diagnostic imaging of heart and coronary circulation: Secondary | ICD-10-CM

## 2020-11-10 DIAGNOSIS — R0602 Shortness of breath: Secondary | ICD-10-CM | POA: Diagnosis not present

## 2020-11-10 DIAGNOSIS — I38 Endocarditis, valve unspecified: Secondary | ICD-10-CM | POA: Diagnosis not present

## 2020-11-10 DIAGNOSIS — I4819 Other persistent atrial fibrillation: Secondary | ICD-10-CM | POA: Diagnosis not present

## 2020-11-10 NOTE — Patient Instructions (Addendum)
Medication Instructions:  Your physician recommends that you continue on your current medications as directed. Please refer to the Current Medication list given to you today.  *If you need a refill on your cardiac medications before your next appointment, please call your pharmacy*   Lab Work: TODAY:  CMET, CBC, TSH, & PRO BNP  If you have labs (blood work) drawn today and your tests are completely normal, you will receive your results only by: Marland Kitchen MyChart Message (if you have MyChart) OR . A paper copy in the mail If you have any lab test that is abnormal or we need to change your treatment, we will call you to review the results.   Testing/Procedures: A chest x-ray takes a picture of the organs and structures inside the chest, including the heart, lungs, and blood vessels. This test can show several things, including, whether the heart is enlarges; whether fluid is building up in the lungs; and whether pacemaker / defibrillator leads are still in place.  Go to [address] [place], [place] [address] [phone]  Your physician has requested that you have an echocardiogram. Echocardiography is a painless test that uses sound waves to create images of your heart. It provides your doctor with information about the size and shape of your heart and how well your heart's chambers and valves are working. This procedure takes approximately one hour. There are no restrictions for this procedure.   Follow-Up: At [facility], you and your health needs are our priority.  As part of our continuing mission to provide you with exceptional heart care, we have created designated Provider Care Teams.  These Care Teams include your primary Cardiologist (physician) and Advanced Practice Providers (APPs -  Physician Assistants and Nurse Practitioners) who all work together to provide you with the care you need, when you need it.  We recommend signing up for the patient  portal called "MyChart".  Sign up information is provided on this After Visit Summary.  MyChart is used to connect with patients for Virtual Visits (Telemedicine).  Patients are able to view lab/test results, encounter notes, upcoming appointments, etc.  Non-urgent messages can be sent to your provider as well.   To learn more about what you can do with MyChart, go to [email or web address].    Your next appointment:    week(s)  AFTER ECHO  The format for your next appointment:   In Person  Provider:   You may see Larae Grooms, MDor one of the following Advanced Practice Providers on your designated Care Team:    Melina Copa, PA-C  Ermalinda Barrios, PA-C    Other Instructions  Echocardiogram An echocardiogram is a test that uses sound waves (ultrasound) to produce images of the heart. Images from an echocardiogram can provide important information about:  Heart size and shape.  The size and thickness and movement of your heart's walls.  Heart muscle function and strength.  Heart valve function or if you have stenosis. Stenosis is when the heart valves are too narrow.  If blood is flowing backward through the heart valves (regurgitation).  A tumor or infectious growth around the heart valves.  Areas of heart muscle that are not working well because of poor blood flow or injury from a heart attack.  Aneurysm detection. An aneurysm is a weak or damaged part of an artery wall. The wall bulges out from the normal force of blood pumping through the body. Tell a health care provider  about:  Any allergies you have.  All medicines you are taking, including vitamins, herbs, eye drops, creams, and over-the-counter medicines.  Any blood disorders you have.  Any surgeries you have had.  Any medical conditions you have.  Whether you are pregnant or may be pregnant. What are the risks? Generally, this is a safe test. However, problems may occur, including an allergic reaction  to dye (contrast) that may be used during the test. What happens before the test? No specific preparation is needed. You may eat and drink normally. What happens during the test?  You will take off your clothes from the waist up and put on a hospital gown.  Electrodes or electrocardiogram (ECG)patches may be placed on your chest. The electrodes or patches are then connected to a device that monitors your heart rate and rhythm.  You will lie down on a table for an ultrasound exam. A gel will be applied to your chest to help sound waves pass through your skin.  A handheld device, called a transducer, will be pressed against your chest and moved over your heart. The transducer produces sound waves that travel to your heart and bounce back (or "echo" back) to the transducer. These sound waves will be captured in real-time and changed into images of your heart that can be viewed on a video monitor. The images will be recorded on a computer and reviewed by your health care provider.  You may be asked to change positions or hold your breath for a short time. This makes it easier to get different views or better views of your heart.  In some cases, you may receive contrast through an IV in one of your veins. This can improve the quality of the pictures from your heart. The procedure may vary among health care providers and hospitals.   What can I expect after the test? You may return to your normal, everyday life, including diet, activities, and medicines, unless your health care provider tells you not to do that. Follow these instructions at home:  It is up to you to get the results of your test. Ask your health care provider, or the department that is doing the test, when your results will be ready.  Keep all follow-up visits. This is important. Summary  An echocardiogram is a test that uses sound waves (ultrasound) to produce images of the heart.  Images from an echocardiogram can provide  important information about the size and shape of your heart, heart muscle function, heart valve function, and other possible heart problems.  You do not need to do anything to prepare before this test. You may eat and drink normally.  After the echocardiogram is completed, you may return to your normal, everyday life, unless your health care provider tells you not to do that. This information is not intended to replace advice given to you by your health care provider. Make sure you discuss any questions you have with your health care provider. Document Revised: 06/07/2020 Document Reviewed: 06/07/2020 Elsevier Patient Education  2021 [employer].

## 2020-11-10 NOTE — Telephone Encounter (Signed)
[name] is returning [name]'s call in regards to [name]'s results. Please advise.

## 2020-11-10 NOTE — Telephone Encounter (Signed)
-----   Message from Charlie Pitter, [place] sent at 11/10/2020  1:43 PM EST ----- Please pt pt/daughter know chest xray does confirm small amount of fluid settling in both lungs, larger on the right than the left. This makes sense with what was heard on his exam. Continue Lasix as we talked about - will await labs and echo as discussed.

## 2020-11-10 NOTE — Telephone Encounter (Signed)
Returned call to daughter, Marcie Bal, [place] on file.  She has been made aware of pt's X-ray results and she verbalized understanding.

## 2020-11-11 ENCOUNTER — Ambulatory Visit (HOSPITAL_COMMUNITY): Payer: Medicare Other | Attending: Cardiovascular Disease

## 2020-11-11 DIAGNOSIS — I4819 Other persistent atrial fibrillation: Secondary | ICD-10-CM | POA: Insufficient documentation

## 2020-11-11 DIAGNOSIS — R931 Abnormal findings on diagnostic imaging of heart and coronary circulation: Secondary | ICD-10-CM

## 2020-11-11 DIAGNOSIS — R634 Abnormal weight loss: Secondary | ICD-10-CM

## 2020-11-11 DIAGNOSIS — R0602 Shortness of breath: Secondary | ICD-10-CM | POA: Insufficient documentation

## 2020-11-11 DIAGNOSIS — I38 Endocarditis, valve unspecified: Secondary | ICD-10-CM | POA: Diagnosis not present

## 2020-11-11 DIAGNOSIS — I10 Essential (primary) hypertension: Secondary | ICD-10-CM | POA: Insufficient documentation

## 2020-11-11 LAB — CBC
Hematocrit: 31.3 % — ABNORMAL LOW (ref 37.5–51.0)
Hemoglobin: 10.7 g/dL — ABNORMAL LOW (ref 13.0–17.7)
MCH: 32.1 pg (ref 26.6–33.0)
MCHC: 34.2 g/dL (ref 31.5–35.7)
MCV: 94 fL (ref 79–97)
Platelets: 273 10*3/uL (ref 150–450)
RBC: 3.33 x10E6/uL — ABNORMAL LOW (ref 4.14–5.80)
RDW: 16.8 % — ABNORMAL HIGH (ref 11.6–15.4)
WBC: 5.4 10*3/uL (ref 3.4–10.8)

## 2020-11-11 LAB — COMPREHENSIVE METABOLIC PANEL
ALT: 17 IU/L (ref 0–44)
AST: 34 IU/L (ref 0–40)
Albumin/Globulin Ratio: 1.3 (ref 1.2–2.2)
Albumin: 3.9 g/dL (ref 3.6–4.6)
Alkaline Phosphatase: 43 IU/L — ABNORMAL LOW (ref 44–121)
BUN/Creatinine Ratio: 18 (ref 10–24)
BUN: 21 mg/dL (ref 8–27)
Bilirubin Total: 0.7 mg/dL (ref 0.0–1.2)
CO2: 26 mmol/L (ref 20–29)
Calcium: 9.6 mg/dL (ref 8.6–10.2)
Chloride: 102 mmol/L (ref 96–106)
Creatinine, Ser: 1.16 mg/dL (ref 0.76–1.27)
GFR calc Af Amer: 65 mL/min/{1.73_m2} (ref >59)
GFR calc non Af Amer: 56 mL/min/{1.73_m2} — ABNORMAL LOW (ref >59)
Globulin, Total: 3 g/dL (ref 1.5–4.5)
Glucose: 217 mg/dL — ABNORMAL HIGH (ref 65–99)
Potassium: 4.6 mmol/L (ref 3.5–5.2)
Sodium: 142 mmol/L (ref 134–144)
Total Protein: 6.9 g/dL (ref 6.0–8.5)

## 2020-11-11 LAB — ECHOCARDIOGRAM COMPLETE
Area-P 1/2: 2.44 cm2
MV VTI: 2.46 cm2
P 1/2 time: 290 msec
S' Lateral: 3.6 cm

## 2020-11-11 LAB — PRO B NATRIURETIC PEPTIDE: NT-Pro BNP: 3018 pg/mL — ABNORMAL HIGH (ref 0–486)

## 2020-11-11 LAB — TSH: TSH: 2.04 u[IU]/mL (ref 0.450–4.500)

## 2020-11-11 MED ORDER — PERFLUTREN LIPID MICROSPHERE
1.0000 mL | INTRAVENOUS | Status: AC | PRN
  Administered 2020-11-11: 1 mL via INTRAVENOUS

## 2020-11-15 ENCOUNTER — Encounter: Payer: Self-pay | Admitting: Physician Assistant
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
## 2020-11-17 ENCOUNTER — Other Ambulatory Visit: Payer: Self-pay

## 2020-11-17 ENCOUNTER — Encounter: Payer: Self-pay | Admitting: Physician Assistant

## 2020-11-17 ENCOUNTER — Ambulatory Visit (INDEPENDENT_AMBULATORY_CARE_PROVIDER_SITE_OTHER): Payer: Medicare Other | Admitting: Physician Assistant

## 2020-11-17 VITALS — BP 120/50 | HR 72 | Ht 70.0 in | Wt 209.0 lb

## 2020-11-17 DIAGNOSIS — D649 Anemia, unspecified: Secondary | ICD-10-CM

## 2020-11-17 DIAGNOSIS — Z79899 Other long term (current) drug therapy: Secondary | ICD-10-CM | POA: Diagnosis not present

## 2020-11-17 DIAGNOSIS — I38 Endocarditis, valve unspecified: Secondary | ICD-10-CM | POA: Diagnosis not present

## 2020-11-17 DIAGNOSIS — J90 Pleural effusion, not elsewhere classified: Secondary | ICD-10-CM | POA: Diagnosis not present

## 2020-11-17 DIAGNOSIS — I5033 Acute on chronic diastolic (congestive) heart failure: Secondary | ICD-10-CM

## 2020-11-17 DIAGNOSIS — R0602 Shortness of breath: Secondary | ICD-10-CM

## 2020-11-17 DIAGNOSIS — I77819 Aortic ectasia, unspecified site: Secondary | ICD-10-CM

## 2020-11-17 DIAGNOSIS — I4819 Other persistent atrial fibrillation: Secondary | ICD-10-CM | POA: Diagnosis not present

## 2020-11-17 NOTE — Patient Instructions (Signed)
Medication Instructions:  Your physician recommends that you continue on your current medications as directed. Please refer to the Current Medication list given to you today.  *If you need a refill on your cardiac medications before your next appointment, please call your pharmacy*   Lab Work: TODAY:  BMET & ANEMIA PANEL  If you have labs (blood work) drawn today and your tests are completely normal, you will receive your results only by: Marland Kitchen MyChart Message (if you have MyChart) OR . A paper copy in the mail If you have any lab test that is abnormal or we need to change your treatment, we will call you to review the results.   Testing/Procedures: None ordered   Follow-Up: At [facility], you and your health needs are our priority.  As part of our continuing mission to provide you with exceptional heart care, we have created designated Provider Care Teams.  These Care Teams include your primary Cardiologist (physician) and Advanced Practice Providers (APPs -  Physician Assistants and Nurse Practitioners) who all work together to provide you with the care you need, when you need it.  We recommend signing up for the patient portal called "MyChart".  Sign up information is provided on this After Visit Summary.  MyChart is used to connect with patients for Virtual Visits (Telemedicine).  Patients are able to view lab/test results, encounter notes, upcoming appointments, etc.  Non-urgent messages can be sent to your provider as well.   To learn more about what you can do with MyChart, go to [email or web address].    Your next appointment:   5 month(s)  (In June as scheduled)  The format for your next appointment:   In Person  Provider:   Casandra Doffing, MD   Other Instructions

## 2020-11-18 ENCOUNTER — Telehealth: Payer: Self-pay | Admitting: *Deleted

## 2020-11-18 LAB — BASIC METABOLIC PANEL
BUN/Creatinine Ratio: 31 — ABNORMAL HIGH (ref 10–24)
BUN: 37 mg/dL — ABNORMAL HIGH (ref 8–27)
CO2: 28 mmol/L (ref 20–29)
Calcium: 9.4 mg/dL (ref 8.6–10.2)
Chloride: 102 mmol/L (ref 96–106)
Creatinine, Ser: 1.21 mg/dL (ref 0.76–1.27)
GFR calc Af Amer: 62 mL/min/{1.73_m2} (ref >59)
GFR calc non Af Amer: 54 mL/min/{1.73_m2} — ABNORMAL LOW (ref >59)
Glucose: 159 mg/dL — ABNORMAL HIGH (ref 65–99)
Potassium: 4.1 mmol/L (ref 3.5–5.2)
Sodium: 143 mmol/L (ref 134–144)

## 2020-11-18 LAB — IRON AND TIBC
Iron Saturation: 21 % (ref 15–55)
Iron: 95 ug/dL (ref 38–169)
Total Iron Binding Capacity: 444 ug/dL (ref 250–450)
UIBC: 349 ug/dL — ABNORMAL HIGH (ref 111–343)

## 2020-11-18 LAB — VITAMIN B12: Vitamin B-12: 923 pg/mL (ref 232–1245)

## 2020-11-18 LAB — FOLATE: Folate: 17.7 ng/mL (ref >3.0)

## 2020-11-18 LAB — FERRITIN: Ferritin: 103 ng/mL (ref 30–400)

## 2020-11-18 MED ORDER — FUROSEMIDE 20 MG PO TABS
20.0000 mg | ORAL_TABLET | ORAL | 1 refills | Status: AC
Start: 2020-11-18 — End: ?

## 2020-11-18 NOTE — Telephone Encounter (Signed)
-----   Message from Charlie Pitter, [place] sent at 11/18/2020  8:15 AM EST ----- Please let Mr. Churchill know labs are stable. I dont see CBC came through but iron studies, B12 and folate are all fairly normal. Can they add the CBC still or at least a Hgb? His BUN is starting to rise proportionate to his creatinine which suggests he may be drying out a little bit. Now that he's feeling better let's move the Lasix 20mg  back to every other day, with instructions that he may take 1 tablet on his off days periodically if he notices increasing symptoms similar to what he just went through, or 3-5lb weight gain- but let us know if he is having to do this regularly so we can follow with labs. Dayna Dunn PA-C

## 2020-12-23 DIAGNOSIS — I4891 Unspecified atrial fibrillation: Secondary | ICD-10-CM | POA: Diagnosis not present

## 2020-12-23 DIAGNOSIS — N183 Chronic kidney disease, stage 3 unspecified: Secondary | ICD-10-CM | POA: Diagnosis not present

## 2020-12-23 DIAGNOSIS — N182 Chronic kidney disease, stage 2 (mild): Secondary | ICD-10-CM | POA: Diagnosis not present

## 2020-12-23 DIAGNOSIS — I10 Essential (primary) hypertension: Secondary | ICD-10-CM | POA: Diagnosis not present

## 2020-12-23 DIAGNOSIS — M1990 Unspecified osteoarthritis, unspecified site: Secondary | ICD-10-CM | POA: Diagnosis not present

## 2020-12-23 DIAGNOSIS — K219 Gastro-esophageal reflux disease without esophagitis: Secondary | ICD-10-CM | POA: Diagnosis not present

## 2020-12-23 DIAGNOSIS — E11319 Type 2 diabetes mellitus with unspecified diabetic retinopathy without macular edema: Secondary | ICD-10-CM | POA: Diagnosis not present

## 2020-12-23 DIAGNOSIS — E782 Mixed hyperlipidemia: Secondary | ICD-10-CM | POA: Diagnosis not present

## 2020-12-23 DIAGNOSIS — G4700 Insomnia, unspecified: Secondary | ICD-10-CM | POA: Diagnosis not present

## 2021-01-04 ENCOUNTER — Telehealth: Payer: Self-pay | Admitting: Interventional Cardiology

## 2021-01-04 NOTE — Telephone Encounter (Signed)
Returned the call to pt.  He is in [facility] and sees his Cardiologist there and they have recommended he get a Watchmen procedure and wanted to know if Dr. Irish Lack does those.  I did advise the pt that Dr. Irish Lack doesn't perform those procedures specifically, but we do have a couple Cardiologists on staff that do. Pt was advised to have his Dr send his records over to Dr. 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 the end of May and if Dr. Irish Lack feels that the Watchmen is what the pt needs, he can make the necessary referrals.  Pt was very appreciative of the call back.

## 2021-01-04 NOTE — Telephone Encounter (Signed)
Patient says that he is in [place], [place] and wants to know if Dr. Irish Lack does a particular procedure. Wants Dr. Irish Lack or nurse to call back.

## 2021-01-09 DIAGNOSIS — L03114 Cellulitis of left upper limb: Secondary | ICD-10-CM | POA: Diagnosis not present

## 2021-01-09 DIAGNOSIS — T148XXA Other injury of unspecified body region, initial encounter: Secondary | ICD-10-CM | POA: Diagnosis not present

## 2021-01-13 DIAGNOSIS — C44329 Squamous cell carcinoma of skin of other parts of face: Secondary | ICD-10-CM | POA: Diagnosis not present

## 2021-01-13 DIAGNOSIS — L578 Other skin changes due to chronic exposure to nonionizing radiation: Secondary | ICD-10-CM | POA: Diagnosis not present

## 2021-01-13 DIAGNOSIS — D485 Neoplasm of uncertain behavior of skin: Secondary | ICD-10-CM | POA: Diagnosis not present

## 2021-01-16 DIAGNOSIS — T148XXA Other injury of unspecified body region, initial encounter: Secondary | ICD-10-CM | POA: Diagnosis not present

## 2021-01-16 DIAGNOSIS — L03114 Cellulitis of left upper limb: Secondary | ICD-10-CM | POA: Diagnosis not present

## 2021-01-23 DIAGNOSIS — L03114 Cellulitis of left upper limb: Secondary | ICD-10-CM | POA: Diagnosis not present

## 2021-01-23 DIAGNOSIS — T148XXA Other injury of unspecified body region, initial encounter: Secondary | ICD-10-CM | POA: Diagnosis not present

## 2021-01-24 DIAGNOSIS — I350 Nonrheumatic aortic (valve) stenosis: Secondary | ICD-10-CM | POA: Diagnosis not present

## 2021-01-24 DIAGNOSIS — I050 Rheumatic mitral stenosis: Secondary | ICD-10-CM | POA: Diagnosis not present

## 2021-01-24 DIAGNOSIS — I4891 Unspecified atrial fibrillation: Secondary | ICD-10-CM | POA: Diagnosis not present

## 2021-01-24 DIAGNOSIS — I351 Nonrheumatic aortic (valve) insufficiency: Secondary | ICD-10-CM | POA: Diagnosis not present

## 2021-01-26 DIAGNOSIS — I4891 Unspecified atrial fibrillation: Secondary | ICD-10-CM | POA: Diagnosis not present

## 2021-01-26 DIAGNOSIS — I10 Essential (primary) hypertension: Secondary | ICD-10-CM | POA: Diagnosis not present

## 2021-01-26 DIAGNOSIS — G4700 Insomnia, unspecified: Secondary | ICD-10-CM | POA: Diagnosis not present

## 2021-01-26 DIAGNOSIS — N182 Chronic kidney disease, stage 2 (mild): Secondary | ICD-10-CM | POA: Diagnosis not present

## 2021-01-26 DIAGNOSIS — M1990 Unspecified osteoarthritis, unspecified site: Secondary | ICD-10-CM | POA: Diagnosis not present

## 2021-01-26 DIAGNOSIS — K219 Gastro-esophageal reflux disease without esophagitis: Secondary | ICD-10-CM | POA: Diagnosis not present

## 2021-01-26 DIAGNOSIS — N183 Chronic kidney disease, stage 3 unspecified: Secondary | ICD-10-CM | POA: Diagnosis not present

## 2021-01-26 DIAGNOSIS — E11319 Type 2 diabetes mellitus with unspecified diabetic retinopathy without macular edema: Secondary | ICD-10-CM | POA: Diagnosis not present

## 2021-01-26 DIAGNOSIS — E782 Mixed hyperlipidemia: Secondary | ICD-10-CM | POA: Diagnosis not present

## 2021-02-03 DIAGNOSIS — G3189 Other specified degenerative diseases of nervous system: Secondary | ICD-10-CM | POA: Diagnosis not present

## 2021-02-03 DIAGNOSIS — M79602 Pain in left arm: Secondary | ICD-10-CM | POA: Diagnosis not present

## 2021-02-03 DIAGNOSIS — S90112A Contusion of left great toe without damage to nail, initial encounter: Secondary | ICD-10-CM | POA: Diagnosis not present

## 2021-02-03 DIAGNOSIS — S0990XA Unspecified injury of head, initial encounter: Secondary | ICD-10-CM | POA: Diagnosis not present

## 2021-02-03 DIAGNOSIS — I6782 Cerebral ischemia: Secondary | ICD-10-CM | POA: Diagnosis not present

## 2021-02-03 DIAGNOSIS — S0181XA Laceration without foreign body of other part of head, initial encounter: Secondary | ICD-10-CM | POA: Diagnosis not present

## 2021-02-03 DIAGNOSIS — M79622 Pain in left upper arm: Secondary | ICD-10-CM | POA: Diagnosis not present

## 2021-02-03 DIAGNOSIS — M79601 Pain in right arm: Secondary | ICD-10-CM | POA: Diagnosis not present

## 2021-02-03 DIAGNOSIS — S81811A Laceration without foreign body, right lower leg, initial encounter: Secondary | ICD-10-CM | POA: Diagnosis not present

## 2021-02-03 DIAGNOSIS — S61412A Laceration without foreign body of left hand, initial encounter: Secondary | ICD-10-CM | POA: Diagnosis not present

## 2021-02-03 DIAGNOSIS — S0083XA Contusion of other part of head, initial encounter: Secondary | ICD-10-CM | POA: Diagnosis not present

## 2021-02-03 DIAGNOSIS — M79621 Pain in right upper arm: Secondary | ICD-10-CM | POA: Diagnosis not present

## 2021-02-03 DIAGNOSIS — W19XXXA Unspecified fall, initial encounter: Secondary | ICD-10-CM | POA: Diagnosis not present

## 2021-02-03 DIAGNOSIS — S41111A Laceration without foreign body of right upper arm, initial encounter: Secondary | ICD-10-CM | POA: Diagnosis not present

## 2021-02-03 DIAGNOSIS — S41112A Laceration without foreign body of left upper arm, initial encounter: Secondary | ICD-10-CM | POA: Diagnosis not present

## 2021-02-06 DIAGNOSIS — T148XXD Other injury of unspecified body region, subsequent encounter: Secondary | ICD-10-CM | POA: Diagnosis not present

## 2021-02-09 DIAGNOSIS — T148XXD Other injury of unspecified body region, subsequent encounter: Secondary | ICD-10-CM | POA: Diagnosis not present

## 2021-02-09 DIAGNOSIS — T148XXA Other injury of unspecified body region, initial encounter: Secondary | ICD-10-CM | POA: Diagnosis not present

## 2021-02-12 IMAGING — DX DG CHEST 2V
2 series · 2 of 2 positions shown · non-contrast
Comparison: May 10, 2020

CLINICAL DATA: Shortness of breath

EXAM:
CHEST - 2 VIEW

[dg chest 2 view (1 of 2)]
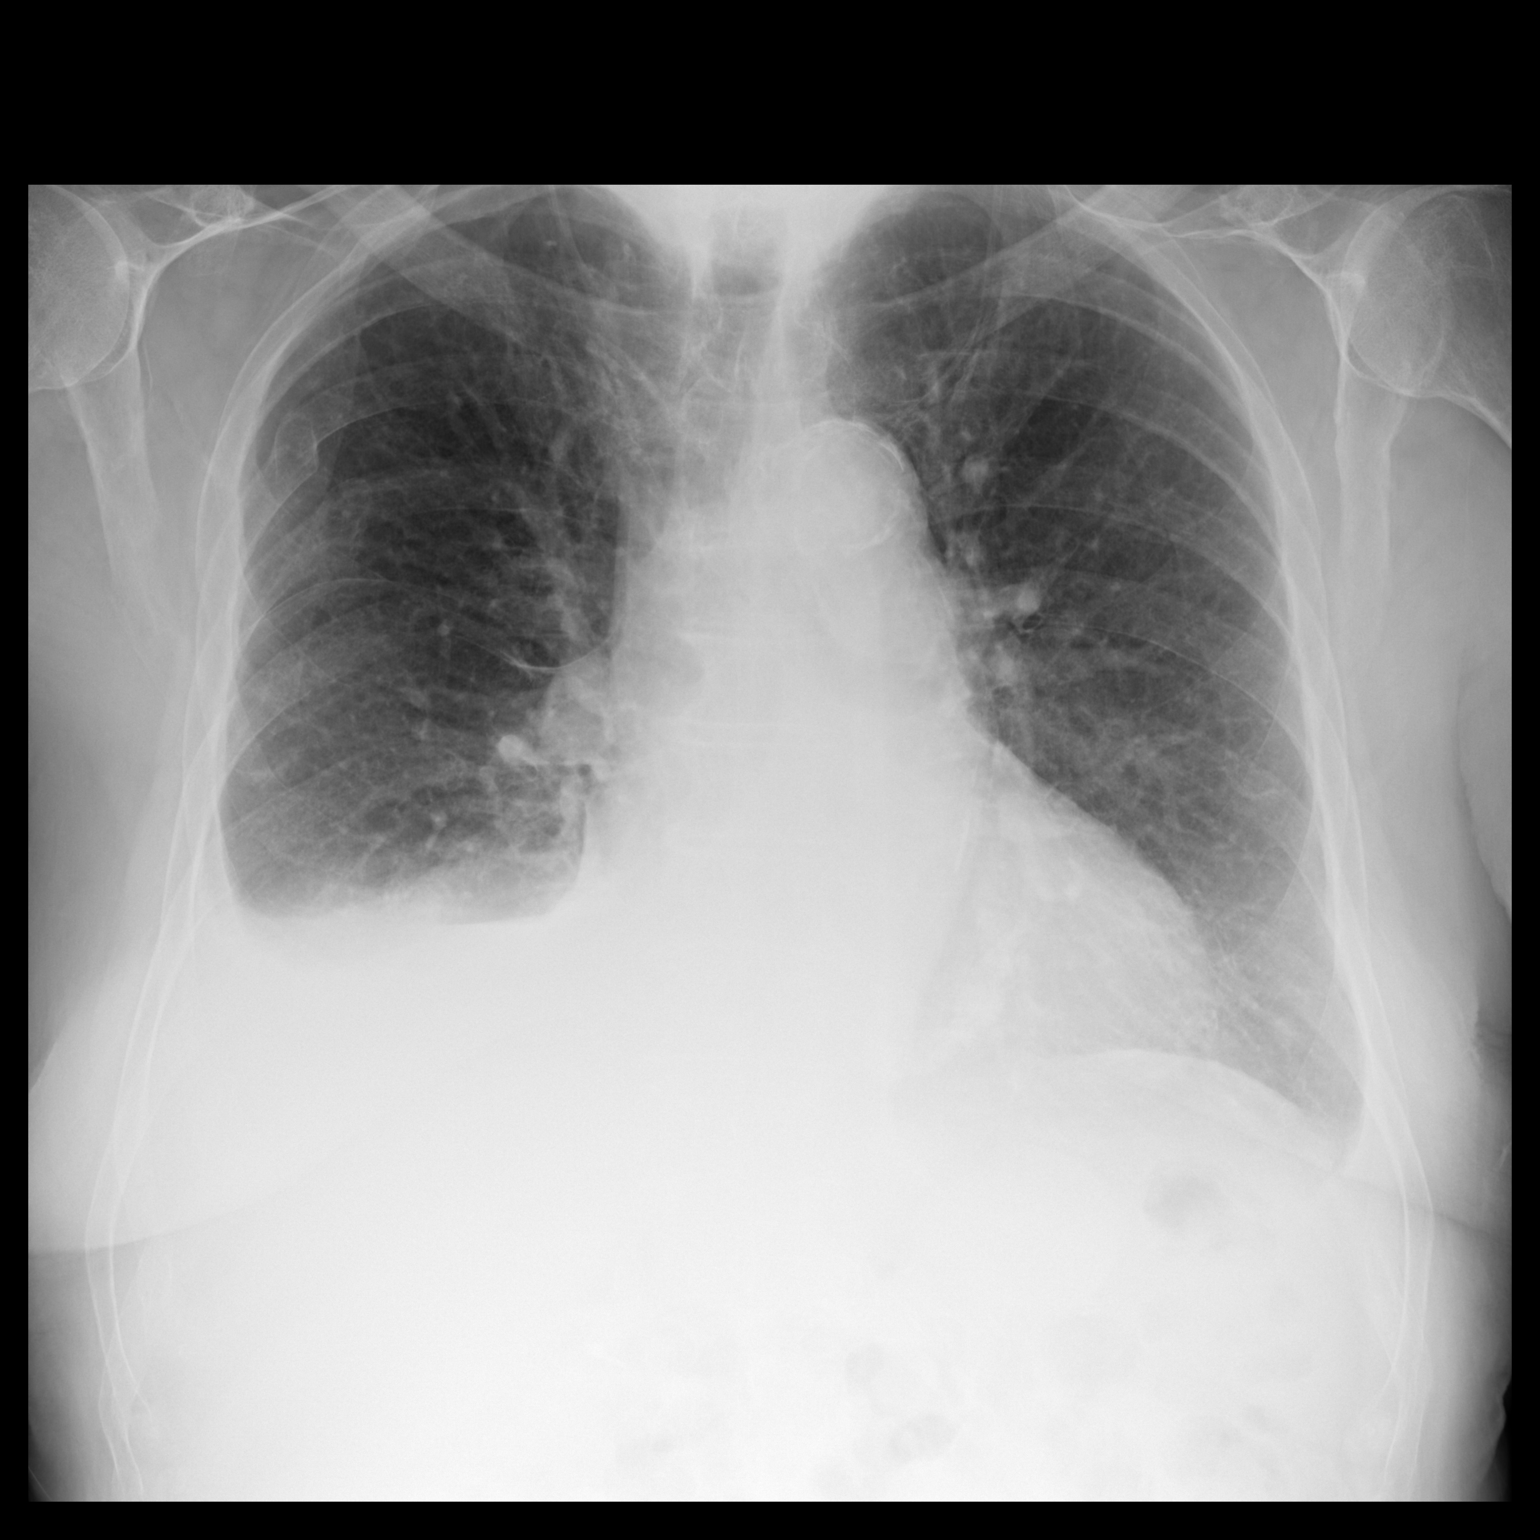

[dg chest 2 view (2 of 2)]
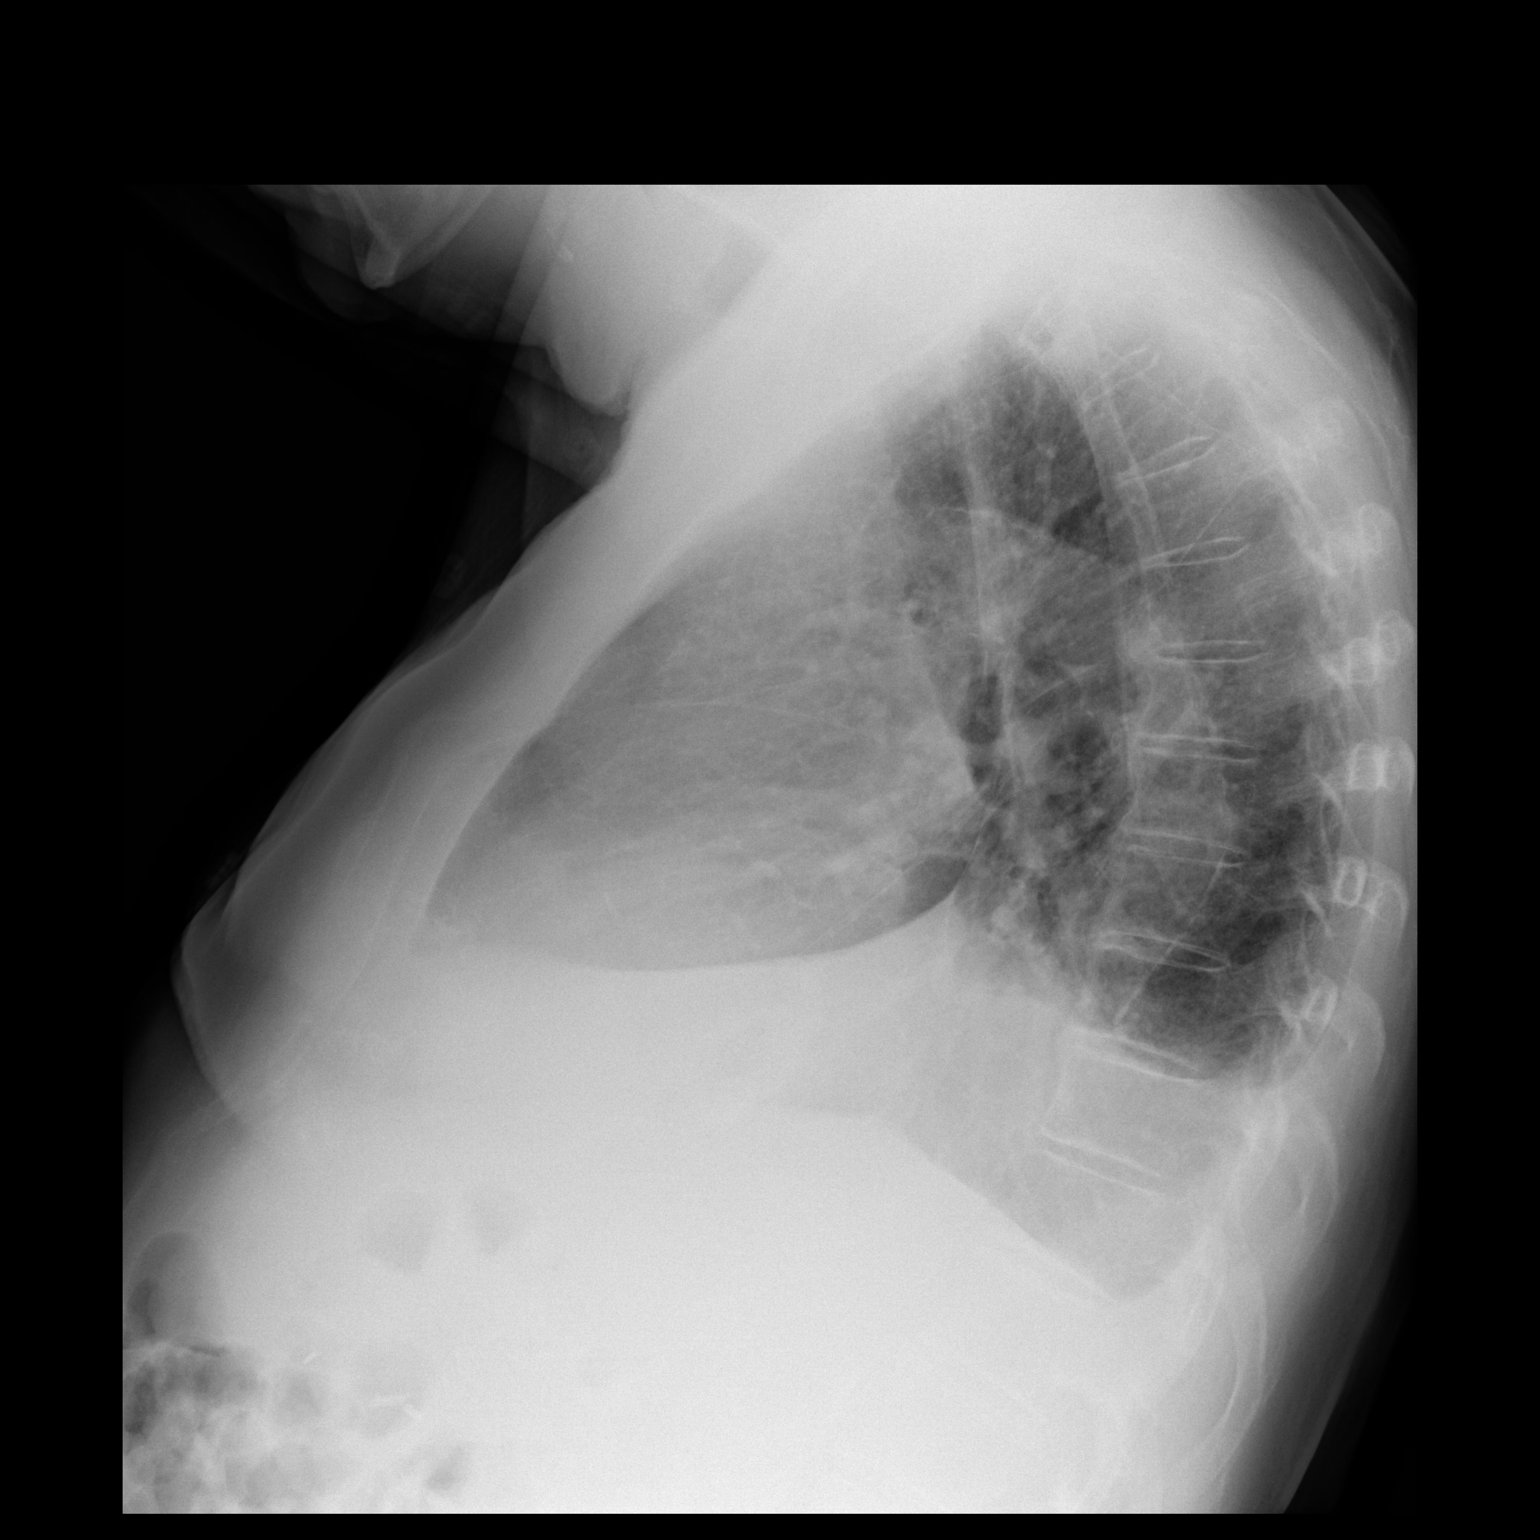

[2 of 2 positions shown; findings below may reference images not displayed]

FINDINGS: There are small pleural effusions bilaterally, larger on the right
than the left. There is apparent atelectatic change in the right
base. No edema or airspace opacity. Heart is upper normal in size
with pulmonary vascularity normal. No adenopathy. There is aortic
atherosclerosis. There old healed rib fractures on the right.
IMPRESSION: Small pleural effusions bilaterally, larger on the right than on the
left with atelectatic change noted in the right base. No edema or
airspace opacity.

Heart upper normal in size.

Aortic Atherosclerosis ([id number]).

## 2021-02-13 DIAGNOSIS — T148XXD Other injury of unspecified body region, subsequent encounter: Secondary | ICD-10-CM | POA: Diagnosis not present

## 2021-02-17 DIAGNOSIS — Z515 Encounter for palliative care: Secondary | ICD-10-CM | POA: Diagnosis not present

## 2021-02-17 DIAGNOSIS — R4701 Aphasia: Secondary | ICD-10-CM | POA: Diagnosis not present

## 2021-02-17 DIAGNOSIS — E785 Hyperlipidemia, unspecified: Secondary | ICD-10-CM | POA: Diagnosis not present

## 2021-02-17 DIAGNOSIS — Z743 Need for continuous supervision: Secondary | ICD-10-CM | POA: Diagnosis not present

## 2021-02-17 DIAGNOSIS — J988 Other specified respiratory disorders: Secondary | ICD-10-CM | POA: Diagnosis not present

## 2021-02-17 DIAGNOSIS — G319 Degenerative disease of nervous system, unspecified: Secondary | ICD-10-CM | POA: Diagnosis not present

## 2021-02-17 DIAGNOSIS — E119 Type 2 diabetes mellitus without complications: Secondary | ICD-10-CM | POA: Diagnosis not present

## 2021-02-17 DIAGNOSIS — I10 Essential (primary) hypertension: Secondary | ICD-10-CM | POA: Diagnosis not present

## 2021-02-17 DIAGNOSIS — I63512 Cerebral infarction due to unspecified occlusion or stenosis of left middle cerebral artery: Secondary | ICD-10-CM | POA: Diagnosis not present

## 2021-02-17 DIAGNOSIS — I4891 Unspecified atrial fibrillation: Secondary | ICD-10-CM | POA: Diagnosis not present

## 2021-02-17 DIAGNOSIS — I6602 Occlusion and stenosis of left middle cerebral artery: Secondary | ICD-10-CM | POA: Diagnosis not present

## 2021-02-17 DIAGNOSIS — I639 Cerebral infarction, unspecified: Secondary | ICD-10-CM | POA: Diagnosis not present

## 2021-02-17 DIAGNOSIS — Z7401 Bed confinement status: Secondary | ICD-10-CM | POA: Diagnosis not present

## 2021-02-17 DIAGNOSIS — M1990 Unspecified osteoarthritis, unspecified site: Secondary | ICD-10-CM | POA: Diagnosis not present

## 2021-02-17 DIAGNOSIS — I669 Occlusion and stenosis of unspecified cerebral artery: Secondary | ICD-10-CM | POA: Diagnosis not present

## 2021-02-17 DIAGNOSIS — I69320 Aphasia following cerebral infarction: Secondary | ICD-10-CM | POA: Diagnosis not present

## 2021-02-17 DIAGNOSIS — Z741 Need for assistance with personal care: Secondary | ICD-10-CM | POA: Diagnosis not present

## 2021-02-17 DIAGNOSIS — R4182 Altered mental status, unspecified: Secondary | ICD-10-CM | POA: Diagnosis not present

## 2021-02-17 DIAGNOSIS — Z7984 Long term (current) use of oral hypoglycemic drugs: Secondary | ICD-10-CM | POA: Diagnosis not present

## 2021-02-17 DIAGNOSIS — K219 Gastro-esophageal reflux disease without esophagitis: Secondary | ICD-10-CM | POA: Diagnosis not present

## 2021-02-17 DIAGNOSIS — I6782 Cerebral ischemia: Secondary | ICD-10-CM | POA: Diagnosis not present

## 2021-02-17 DIAGNOSIS — I6522 Occlusion and stenosis of left carotid artery: Secondary | ICD-10-CM | POA: Diagnosis not present

## 2021-02-17 DIAGNOSIS — Z79899 Other long term (current) drug therapy: Secondary | ICD-10-CM | POA: Diagnosis not present

## 2021-02-17 DIAGNOSIS — R531 Weakness: Secondary | ICD-10-CM | POA: Diagnosis not present

## 2021-02-18 DIAGNOSIS — I10 Essential (primary) hypertension: Secondary | ICD-10-CM | POA: Diagnosis not present

## 2021-02-18 DIAGNOSIS — R4182 Altered mental status, unspecified: Secondary | ICD-10-CM | POA: Diagnosis not present

## 2021-02-18 DIAGNOSIS — I4891 Unspecified atrial fibrillation: Secondary | ICD-10-CM | POA: Diagnosis not present

## 2021-02-18 DIAGNOSIS — E785 Hyperlipidemia, unspecified: Secondary | ICD-10-CM | POA: Diagnosis not present

## 2021-02-18 DIAGNOSIS — E119 Type 2 diabetes mellitus without complications: Secondary | ICD-10-CM | POA: Diagnosis not present

## 2021-02-18 DIAGNOSIS — I69320 Aphasia following cerebral infarction: Secondary | ICD-10-CM | POA: Diagnosis not present

## 2021-02-19 DIAGNOSIS — I4891 Unspecified atrial fibrillation: Secondary | ICD-10-CM | POA: Diagnosis not present

## 2021-02-19 DIAGNOSIS — R4182 Altered mental status, unspecified: Secondary | ICD-10-CM | POA: Diagnosis not present

## 2021-02-19 DIAGNOSIS — E785 Hyperlipidemia, unspecified: Secondary | ICD-10-CM | POA: Diagnosis not present

## 2021-02-19 DIAGNOSIS — E119 Type 2 diabetes mellitus without complications: Secondary | ICD-10-CM | POA: Diagnosis not present

## 2021-02-19 DIAGNOSIS — I69320 Aphasia following cerebral infarction: Secondary | ICD-10-CM | POA: Diagnosis not present

## 2021-02-19 DIAGNOSIS — I10 Essential (primary) hypertension: Secondary | ICD-10-CM | POA: Diagnosis not present

## 2021-02-26 DEATH — deceased

## 2021-04-05 ENCOUNTER — Ambulatory Visit: Payer: Medicare Other | Admitting: Interventional Cardiology
